# Patient Record
Sex: Male | Born: 1935 | Race: White | Hispanic: No | Smoking: Never smoker
Health system: Southern US, Community
[De-identification: ages and names within clinical notes are randomized; demographics above are authoritative.]

## PROBLEM LIST (undated history)

## (undated) DIAGNOSIS — F0390 Unspecified dementia without behavioral disturbance: Secondary | ICD-10-CM

## (undated) DIAGNOSIS — C801 Malignant (primary) neoplasm, unspecified: Secondary | ICD-10-CM

## (undated) DIAGNOSIS — R001 Bradycardia, unspecified: Secondary | ICD-10-CM

## (undated) DIAGNOSIS — K219 Gastro-esophageal reflux disease without esophagitis: Secondary | ICD-10-CM

## (undated) DIAGNOSIS — I509 Heart failure, unspecified: Secondary | ICD-10-CM

## (undated) DIAGNOSIS — I4589 Other specified conduction disorders: Secondary | ICD-10-CM

## (undated) DIAGNOSIS — IMO0002 Reserved for concepts with insufficient information to code with codable children: Secondary | ICD-10-CM

## (undated) DIAGNOSIS — I10 Essential (primary) hypertension: Secondary | ICD-10-CM

## (undated) DIAGNOSIS — E785 Hyperlipidemia, unspecified: Secondary | ICD-10-CM

## (undated) DIAGNOSIS — R9430 Abnormal result of cardiovascular function study, unspecified: Secondary | ICD-10-CM

## (undated) DIAGNOSIS — Z8719 Personal history of other diseases of the digestive system: Secondary | ICD-10-CM

## (undated) DIAGNOSIS — I2510 Atherosclerotic heart disease of native coronary artery without angina pectoris: Secondary | ICD-10-CM

## (undated) HISTORY — DX: Personal history of other diseases of the digestive system: Z87.19

## (undated) HISTORY — DX: Abnormal result of cardiovascular function study, unspecified: R94.30

## (undated) HISTORY — PX: COLECTOMY: SHX59

## (undated) HISTORY — DX: Other specified conduction disorders: I45.89

## (undated) HISTORY — DX: Essential (primary) hypertension: I10

## (undated) HISTORY — DX: Heart failure, unspecified: I50.9

## (undated) HISTORY — DX: Bradycardia, unspecified: R00.1

## (undated) HISTORY — DX: Atherosclerotic heart disease of native coronary artery without angina pectoris: I25.10

## (undated) HISTORY — PX: TONSILLECTOMY: SUR1361

## (undated) HISTORY — DX: Gastro-esophageal reflux disease without esophagitis: K21.9

## (undated) HISTORY — DX: Hyperlipidemia, unspecified: E78.5

## (undated) HISTORY — DX: Malignant (primary) neoplasm, unspecified: C80.1

## (undated) HISTORY — DX: Reserved for concepts with insufficient information to code with codable children: IMO0002

---

## 2007-12-14 ENCOUNTER — Ambulatory Visit: Payer: Self-pay | Admitting: Internal Medicine

## 2007-12-30 ENCOUNTER — Ambulatory Visit: Payer: Self-pay

## 2007-12-30 ENCOUNTER — Ambulatory Visit: Payer: Self-pay | Admitting: Internal Medicine

## 2007-12-30 ENCOUNTER — Encounter: Payer: Self-pay | Admitting: Internal Medicine

## 2008-02-06 ENCOUNTER — Ambulatory Visit: Payer: Self-pay | Admitting: Internal Medicine

## 2008-05-02 ENCOUNTER — Encounter: Payer: Self-pay | Admitting: Cardiology

## 2008-05-02 ENCOUNTER — Ambulatory Visit: Payer: Self-pay | Admitting: Cardiology

## 2008-09-10 ENCOUNTER — Encounter (INDEPENDENT_AMBULATORY_CARE_PROVIDER_SITE_OTHER): Payer: Self-pay | Admitting: *Deleted

## 2008-11-21 ENCOUNTER — Ambulatory Visit: Payer: Self-pay | Admitting: Cardiology

## 2008-11-21 ENCOUNTER — Encounter: Payer: Self-pay | Admitting: Cardiology

## 2010-01-15 ENCOUNTER — Encounter: Payer: Self-pay | Admitting: Cardiology

## 2010-01-15 ENCOUNTER — Ambulatory Visit: Payer: Self-pay | Admitting: Cardiology

## 2010-02-24 ENCOUNTER — Telehealth (INDEPENDENT_AMBULATORY_CARE_PROVIDER_SITE_OTHER): Payer: Self-pay | Admitting: Radiology

## 2010-02-25 ENCOUNTER — Ambulatory Visit: Admission: RE | Admit: 2010-02-25 | Discharge: 2010-02-25 | Payer: Self-pay | Source: Home / Self Care

## 2010-02-25 ENCOUNTER — Encounter: Payer: Self-pay | Admitting: Cardiovascular Disease

## 2010-02-25 ENCOUNTER — Encounter (HOSPITAL_COMMUNITY)
Admission: RE | Admit: 2010-02-25 | Discharge: 2010-03-18 | Payer: Self-pay | Source: Home / Self Care | Attending: Cardiology | Admitting: Cardiology

## 2010-02-25 ENCOUNTER — Encounter: Payer: Self-pay | Admitting: Cardiology

## 2010-02-26 ENCOUNTER — Telehealth: Payer: Self-pay | Admitting: Cardiology

## 2010-03-18 NOTE — Assessment & Plan Note (Signed)
Summary: [facility]   Visit Type:  Follow-up Primary Kelcey Korus:  Larina Bras  CC:  No complaints.  History of Present Illness: Pleasant male who presents for followup for coronary disease and ischemic cardiomyopathy improved by most recent echocardiogram.  Per Dr. Odessa Fleming previous notes the patient underwent a partial colectomy in September of 2009 at [facility]. He apparently had a respiratory arrest and a small myocardial infarction. He underwent cardiac catheterization and was found to have an ejection fraction of 10-15%, a totally occluded right coronary artery which was old and a patent marginal and circumflex. There was a patent LAD and a 90% diagonal. There were left to right collaterals. He has been treated medically. Note I do not have any of those records available. He saw Dr. Graciela Husbands on October 20 of 2009 for question ICD. However the patient did have a followup echocardiogram on November 13 of 2009. His ejection fraction was 50% with possible hypokinesis of the inferior posterior wall. There was mild mitral regurgitation. The patient also had an exercise treadmill to assess chronotropic competence. He exercised to stage II of the Naughton  protocol and achieved a heart rate of 88. I last saw him in Oct 2010. Since then he denies any dyspnea on exertion, orthopnea, pedal edema, palpitations, syncope or exertional chest pain.  Current Medications (verified): 1)  Aspirin Adult Low Strength 81 Mg Tbec (Aspirin) .... By Mouth Once Daily 2)  Cvs Omeprazole 20 Mg Tbec (Omeprazole) .... By Mouth Once Daily 3)  Coreg 6.25 Mg Tabs (Carvedilol) .... One Half Tab Two Times A Day 4)  Imdur 30 Mg Xr24h-Tab (Isosorbide Mononitrate) .... Once Daily 5)  Lisinopril 5 Mg Tabs (Lisinopril) .Marland Kitchen.. 1 Tablet By Mouth Daily 6)  Crestor 20 Mg Tabs (Rosuvastatin Calcium) .... 1/2 Tablet Daily 7)  Amlodipine Besylate 5 Mg Tabs (Amlodipine Besylate) .... Take One Tablet By Mouth Daily 8)   Tamsulosin Hcl 0.4 Mg Caps (Tamsulosin Hcl) .... Take 1 Capsule By Mouth Two Times A Day  Allergies (verified): No Known Drug Allergies  Past History:  Past Medical History: coronary artery disease H/O cardiomyopathy improved on most recent echo. Congestive heart failure - class II to III GE reflux disease History of ulcerative colitis Bradycardia  Colonic malignancy Hyperlipidemia Hypertension       Past Surgical History: His past surgical history is notable for his tonsillectomy and his colectomy.   Social History: Reviewed history from 05/01/2008 and no changes required. Married Tobacco Use - No.  Alcohol Use - yes--occasional Drug Use - no Retired   Review of Systems       Problems with constipation but no fevers or chills, productive cough, hemoptysis, dysphasia, odynophagia, melena, hematochezia, dysuria, hematuria, rash, seizure activity, orthopnea, PND, pedal edema, claudication. Remaining systems are negative.   Vital Signs:  Patient profile:   77 year old male Height:      66 inches Weight:      141.75 pounds BMI:     22.96 Pulse rate:   48 / minute Pulse rhythm:   regular Resp:     18 per minute BP sitting:   140 / 60  (left arm) Cuff size:   large  Vitals Entered By: Vikki Ports (January 15, 2010 10:16 AM)  Physical Exam  General:  Well-developed well-nourished in no acute distress.  Skin is warm and dry.  HEENT is normal.  Neck is supple. No thyromegaly.  Chest is clear to auscultation with normal expansion.  Cardiovascular exam is  regular rate and rhythm.  Abdominal exam nontender or distended. No masses palpated. Extremities show no edema. neuro grossly intact    EKG  Procedure date:  01/15/2010  Findings:      Marked sinus bradycardia at a rate of 48. No ST changes.  Impression & Recommendations:  Problem # 1:  CARDIOMYOPATHY, ISCHEMIC (ICD-414.8)  Improved on most recent echocardiogram. Continue present medications. The  following medications were removed from the medication list:    Furosemide 40 Mg Tabs (Furosemide) .Marland Kitchen... As needed if over 137 pounds His updated medication list for this problem includes:    Aspirin Adult Low Strength 81 Mg Tbec (Aspirin) ..... By mouth once daily    Coreg 6.25 Mg Tabs (Carvedilol) ..... One half tab two times a day    Imdur 30 Mg Xr24h-tab (Isosorbide mononitrate) ..... Once daily    Lisinopril 5 Mg Tabs (Lisinopril) .Marland Kitchen... 1 tablet by mouth daily    Amlodipine Besylate 5 Mg Tabs (Amlodipine besylate) .Marland Kitchen... Take one tablet by mouth daily  The following medications were removed from the medication list:    Furosemide 40 Mg Tabs (Furosemide) .Marland Kitchen... As needed if over 137 pounds His updated medication list for this problem includes:    Aspirin Adult Low Strength 81 Mg Tbec (Aspirin) ..... By mouth once daily    Coreg 6.25 Mg Tabs (Carvedilol) ..... One half tab two times a day    Imdur 30 Mg Xr24h-tab (Isosorbide mononitrate) ..... Once daily    Lisinopril 5 Mg Tabs (Lisinopril) .Marland Kitchen... 1 tablet by mouth daily    Amlodipine Besylate 5 Mg Tabs (Amlodipine besylate) .Marland Kitchen... Take one tablet by mouth daily  Problem # 2:  HYPERCHOLESTEROLEMIA-PURE (ICD-272.0) Continue statin. Lipids and liver monitored by primary care. His updated medication list for this problem includes:    Crestor 20 Mg Tabs (Rosuvastatin calcium) .Marland Kitchen... 1/2 tablet daily  His updated medication list for this problem includes:    Crestor 20 Mg Tabs (Rosuvastatin calcium) .Marland Kitchen... 1/2 tablet daily  Problem # 3:  CORONARY ATHEROSCLEROSIS NATIVE CORONARY ARTERY (ICD-414.01) Continue aspirin, beta blocker, ACE inhibitor and statin. Schedule Myoview for risk stratification. His updated medication list for this problem includes:    Aspirin Adult Low Strength 81 Mg Tbec (Aspirin) ..... By mouth once daily    Coreg 6.25 Mg Tabs (Carvedilol) ..... One half tab two times a day    Imdur 30 Mg Xr24h-tab (Isosorbide mononitrate)  ..... Once daily    Lisinopril 5 Mg Tabs (Lisinopril) .Marland Kitchen... 1 tablet by mouth daily    Amlodipine Besylate 5 Mg Tabs (Amlodipine besylate) .Marland Kitchen... Take one tablet by mouth daily  Orders: Nuclear Stress Test (Nuc Stress Test)  Problem # 4:  Hx of ESSENTIAL HYPERTENSION, BENIGN (ICD-401.1) Blood pressure control. Continue present medications. Potassium and renal function monitored by primary care. The following medications were removed from the medication list:    Furosemide 40 Mg Tabs (Furosemide) .Marland Kitchen... As needed if over 137 pounds His updated medication list for this problem includes:    Aspirin Adult Low Strength 81 Mg Tbec (Aspirin) ..... By mouth once daily    Coreg 6.25 Mg Tabs (Carvedilol) ..... One half tab two times a day    Lisinopril 5 Mg Tabs (Lisinopril) .Marland Kitchen... 1 tablet by mouth daily    Amlodipine Besylate 5 Mg Tabs (Amlodipine besylate) .Marland Kitchen... Take one tablet by mouth daily  Patient Instructions: 1)  Your physician wants you to follow-up in:ONE YEAR   You will receive a reminder letter  in the mail two months in advance. If you don't receive a letter, please call our office to schedule the follow-up appointment. 2)  Your physician has requested that you have an LEXISCAN stress myoview.  For further information please visit [email or web address]  Please follow instruction sheet, as given.

## 2010-03-20 NOTE — Assessment & Plan Note (Signed)
Summary: Cardiology Nuclear Testing  Nuclear Med Background Indications for Stress Test: Evaluation for Ischemia   History: Echo, GXT, Heart Catheterization, Myocardial Infarction  History Comments: '09 GXT /Cath / MI -resp. arrest/100%RCA & 90% DX with coll.; 11/09 Echo EF=50%; Hx. ICM /CHF & Hx. Bradycardia  Symptoms: DOE, SOB    Nuclear Pre-Procedure Cardiac Risk Factors: Hypertension, Lipids Caffeine/Decaff Intake: None NPO After: 8:30 AM Lungs: clear IV 0.9% NS with Angio Cath: 20g     IV Site: R Wrist IV Started by: Stanton Kidney, EMT-P Chest Size (in) 40     Height (in): 65 Weight (lb): 143 BMI: 23.88  Nuclear Med Study 1 or 2 day study:  1 day     Stress Test Type:  Eugenie Birks Reading MD:  Kristeen Miss, MD     Referring MD:  B.Crenshaw Resting Radionuclide:  Technetium 79m Tetrofosmin     Resting Radionuclide Dose:  10.9 mCi  Stress Radionuclide:  Technetium 81m Tetrofosmin     Stress Radionuclide Dose:  33 mCi   Stress Protocol  Max Systolic BP: 148 mm Hg Lexiscan: 0.4 mg   Stress Test Technologist:  Milana Na, EMT-P     Nuclear Technologist:  Domenic Polite, CNMT  Rest Procedure  Myocardial perfusion imaging was performed at rest 45 minutes following the intravenous administration of Technetium 51m Tetrofosmin.  Stress Procedure  The patient received IV Lexiscan 0.4 mg over 15-seconds.  Technetium 56m Tetrofosmin injected at 30-seconds. The patient had sob with infusion. There were no significant changes with infusion.  Quantitative spect images were obtained after a 45 minute delay.  QPS Raw Data Images:  There is interference from nuclear activity from structures below the diaphragm.   Stress Images:  There is apical thinning and decrease uptake in the inferior wall ( most likely due to uptake in the adjacent bowel) Rest Images:  There is apical thinning and decrease uptake in the inferior wall ( most likely due to uptake in the adjacent  bowel) Subtraction (SDS):  No evidence of ischemia. Transient Ischemic Dilatation:  .95  (Normal <1.22)  Lung/Heart Ratio:  .33  (Normal <0.45)  Quantitative Gated Spect Images QGS EDV:  112 ml QGS ESV:  46 ml QGS EF:  59 % QGS cine images:  Normal LV function.  Findings Low risk nuclear study      Overall Impression  Exercise Capacity: Lexiscan with no exercise. BP Response: Normal blood pressure response. Clinical Symptoms: No chest pain ECG Impression: No significant ST segment change suggestive of ischemia. Overall Impression: Low risk stress nuclear study. Overall Impression Comments: There is decreased uptake in the inferior wall with stress and rest.  This is most likely due to significant uptake of tracer in the adjacent bowel. The inferior wall contracts normally.  The LV function is normal.  Appended Document: Cardiology Nuclear Testing ok  Appended Document: Cardiology Nuclear Testing lmtcb./cy  Appended Document: Cardiology Nuclear Testing PT AWARE./CY

## 2010-03-20 NOTE — Progress Notes (Signed)
Summary: nuc pre-procedure  Phone Note Outgoing Call   Call placed by: Domenic Polite, CNMT,  February 24, 2010 12:22 PM Call placed to: Patient Reason for Call: Confirm/change Appt Summary of Call: Reviewed information on Myoview Information Sheet (see scanned document for further details).  Spoke with patient.      Nuclear Med Background Indications for Stress Test: Evaluation for Ischemia   History: Echo, GXT, Heart Catheterization, Myocardial Infarction  History Comments: '09 GXT /Cath / MI -resp. arrest/100%RCA & 90% DX with coll.; 11/09 Echo EF=50%; Hx. ICM /CHF & Hx. Bradycardia     Nuclear Pre-Procedure Cardiac Risk Factors: Hypertension, Lipids Height (in): 66

## 2010-03-20 NOTE — Progress Notes (Signed)
Summary: test results  Phone Note Call from Patient Call back at Home Phone [phone]   Caller: Patient Reason for Call: Talk to Nurse, Lab or Test Results Summary of Call: pt returning the call re test results. pt has an appt at 245pm pt would like to have a call back before 245pm today. Initial call taken by: Roe Coombs,  February 26, 2010 10:27 AM  Follow-up for Phone Call        Phone Call Completed PT AWARE OF MYOVIEW RESULTS. Follow-up by: Scherrie Bateman, LPN,  February 26, 2010 10:56 AM

## 2010-07-01 NOTE — Assessment & Plan Note (Signed)
[facility]                         ELECTROPHYSIOLOGY OFFICE NOTE   LEELAN, RAJEWSKI                      MRN:          [id number]  DATE:02/06/2008                            DOB:          1923/03/05    Mr. Harmes was admitted for a repeat treadmill testing to assess  chronotropic competence.  He achieved a maximum heart rate of 103, which  was 74% of his predicted maximal heart rate.  His blood pressure  elevation was brisk going from 151 systolic to 204 systolic.   He had no symptoms.  He did have some bigeminy on recovery.   ASSESSMENT:  Chronotropic competence.   We will plan to have him follow up with Dr. Jens Som at the [facility]  office in about 3 months' time.   He will follow up with Dr. Larina Bras in the interim.     Duke Salvia, MD, [facility]  Electronically Signed    SCK/MedQ  DD: 02/06/2008  DT: 02/07/2008  Job #: [id number]   cc:   Marlou Starks

## 2010-07-01 NOTE — Letter (Signed)
December 30, 2007    Dr. Marlou Starks  [address]  [place]  [place] [address]   RE:  Keith Conrad, Keith Conrad  MRN:  [id number]  /  DOB:  1923-07-01   Dear Dr. Larina Bras:   I have had the privilege of seeing Mr. Keith Conrad at the request of  his son for second opinion regarding need for an ICD in the setting of  his ischemic heart disease.  We also had a question on the table as to  whether he is chronotropically incompetent when he came in today for a  heart rate assessment on treadmill testing.  He was able to accomplish a  little bit of stage II on a Naughton protocol and achieved a maximum  heart rate of 88, which represents about 65-70% of his predicted maximum  heart rate.  Of greater note was his blood pressure, which was in the  range of 165-216, while he was here.  This was asymptomatic.  He had  been seen the other day and somebody had given him clonidine to take on  an as-needed basis for blood pressures above 170.   I have advised him today to take clonidine while he is here today to get  his blood pressure down and then we will put him on Norvasc 5 mg a day.  I have asked him to follow up with you in a week to two weeks' time.  Aggressive management of his blood pressure would be terrific and  then we could reassess his exercise capacity with anticipated treadmill  testing in about 2 months' time.   I look forward to talking to you.  If I can be of any help, do not  hesitate to call me.  My cell number is [phone].    Sincerely,      Duke Salvia, MD, [facility]  Electronically Signed    SCK/MedQ  DD: 12/30/2007  DT: 12/31/2007  Job #: [id number]

## 2010-07-01 NOTE — Letter (Signed)
December 14, 2007    Keith Abed, MD, [facility]  [address]  [address]  [place], [place] [address]   RE:  Keith, Conrad  MRN:  [id number]  /  DOB:  01-25-1924   Dear Keith Conrad,   It was a pleasure to see Keith Conrad, the father of your patient Keith Conrad, today for evaluation of his need for a defibrillator.   As you may or may not know, Keith Conrad is an 77 year old gentleman  who has had a very sparse past medical history apart from long-standing  treated hypertension.  As recently as the summer, he was pushing his  lawnmower over a half of an acre weekly.   In September, he underwent a partial colectomy for malignancy.  On the  second postoperative day.  He had what was called a class IV code  which sounds like, from his son, was a respiratory arrest.  He ended up  intubated.  He subsequently had a myocardial infarction with a peak  troponin recorded in the discharge summary of 2.87 and a peak MB of 26.9  and underwent catheterization at which time he was found to have an  ejection fraction of 10-15%, a totally occluded right which was old and  a patent marginal and patent circumflex system and a patent LAD with a  90% diagonal.  There were left-to-right collaterals.  He was seen in  consultation by EP, and an ICD was recommended.  Because of concerns,  they elected not to have it done.   He was subsequently discharged to rehab on a variety of medications  including lisinopril and spironolactone.  I should note that he had  hypokalemia in the hospital and then apparently developed hyperkalemia  while at rehab.  There are also issues of hypotension developing in  rehab, and there were subsequent discontinuation of a lot of his  medications.   This is potentially relevant as on the second or third day postdischarge  from rehab.  He had a syncopal episode.  He had gone to breakfast with a  lady friend.  He went to get out of the car having had breakfast.  He  recalls  nothing except opening the door and find himself on the floor of  the carport.  We do not know whether he stood up before he fell or not.   Since discharge, he has also had significant problems with exercise  intolerance.  He has fatigue, dyspnea on exertion, so that now he is  quite short of breath, walking up the driveway from his mailbox.  This  used to be not an issue.  He has also had some peripheral edema for  which Lasix was recently started with some improvement.  He does not  have nocturnal dyspnea or orthopnea.   He has had no palpitations.   He does have some orthostatic intolerance with abrupt standing.   His past related history is notable for bradycardia to the 30s in the  spring of 2009.  It looks like he was treated with atenolol for many  years for his blood pressure and then this spring became quite  bradycardic.  He did not recall exercise intolerance similar to his  current situation in the spring notwithstanding the low blood pressure.  This prompted the discontinuation of his atenolol.   His past medical history in addition to the above as noted is broadly  negative:  1. He does carry a secondary diagnosis of diabetes, although  he is on      no medications for it and I do not see any hemoglobin A1c.  2. GE reflux disease.  3. History of ulcerative colitis.   His past surgical history is notable for his tonsillectomy and his  colectomy.   His allergies include ATENOLOL and PSEUDOEPHEDRINE   His current medications include:  1. Aspirin 81.  2. Omeprazole 20.  3. Coreg 6.25 b.i.d.  4. Crestor 20.  5. Imdur 30.  6. Prilosec.  7. Lasix.   As noted, his ACE inhibitors and spironolactone were discontinued  because of low blood pressure/potassium issues.   SOCIAL HISTORY:  He is retired from [employer].  He has 2  children.  He does not use cigarettes or recreational drugs, and drinks  alcohol only rarely.   On examination, he is an elderly  Caucasian male who is somewhat hard of  hearing.  He was in no acute distress.  His blood pressure is 152/58.  His pulse was 53.  His weight was 143.  His HEENT exam demonstrated no  icterus or xanthoma, and his neck veins were 8 cm.  His carotids were  brisk and full bilaterally with a left-sided bruit.  The back was  without kyphosis or scoliosis.  The lungs were clear.  Heart sounds were  slow, but regular, and S4 was present.  The abdomen was soft with active  bowel sounds without midline pulsation or hepatomegaly.  Femoral pulses  were 2+.  Distal pulses were intact.  There was no clubbing, cyanosis,  or edema.  Neurological exam was grossly normal.  His skin was warm and  dry.   Electrocardiogram dated today demonstrated sinus rhythm at 53 with  intervals of 0.17/0.11/0.46.  The axis was 9 degrees.  There were T-wave  inversions across the lateral precordium.  We have no information as to  whether this is new or old.   IMPRESSION:  1. Ischemic and nonischemic cardiomyopathy with a totalled right      coronary artery and a high-grade diagonal lesion and ejection      fraction of 10-15% at post-arrest catheterization.  2. Congestive heart failure - class II to III.  3. Syncopal episode in the wake of discharge from rehab, question      orthostatic.  4. Bradycardia now and in the past, question chronotropic competence.  5. Cardiorespiratory arrest associated with a non-Q-wave myocardial      infarction at that time of his surgery.  6. Colonic malignancy requiring partial colectomy associated with the      above.   DISCUSSION:  Keith Conrad, Keith Conrad had what sounds like a respiratory  arrest in the wake of his partial colectomy.  It sounds like he then had  a non-STEMI which may have been a demand infarction.  He underwent a  catheterization and was found to have high-grade diagonal disease,  chronically occluded right, and ejection fraction of 10-15%.  It is  unclear as to the  duration of his nonischemic myopathy.  It is hard to  imagine that his ejection fraction is 10-15% given the vigor with which  he lived up until his surgery.  We do not have an alternative  explanation, however.   He currently has congestive heart failure which certainly might be  attributable to his ejection fraction; however, had the LVEF been  chronically low than that would not suffice as an explanation.  Chronotropic incompetence may well be contributing also and this may  have  been aggravated by his beta-blocker.   His syncopal episode is also quite worrisome.  Although by history it  sounds temporally related to a likely effort to get up from the car, any  episodes of syncope in cardiomyopathic patients is associated with high  risk of sudden death and appropriate ICD discharges.   We are also left with the issue of the duration of his nonischemic  myopathy and hoping that there will be some improvement in it not  knowing what its course has been here to date.  As he did not undergo  revascularization, that issue is not relevant.   After a lengthy discussion with Mr. Catalfamo and his son Harvie Heck, we have  elected to pursue the following course:  1. Repeated 2-D echo to see if there has been any interval improvement      in his ejection fraction.  2. Undertake treadmill testing to see to what degree he is      chronotropically limited.   These things may prompt intervention with a device, the specific type of  which we could then decided upon.   He also will need to be restarted on his ACE inhibitor with close  followup by his primary care physician, and we will arrange that at his  next visit which I think is scheduled for next week.   Keith Conrad, thank you very much allowing me to participate in the care of your  friend's father.    Sincerely,      Duke Salvia, MD, [facility]  Electronically Signed    SCK/MedQ  DD: 12/14/2007  DT: 12/15/2007  Job #: [id number]

## 2010-09-30 ENCOUNTER — Encounter: Payer: Self-pay | Admitting: Cardiology

## 2011-02-03 ENCOUNTER — Encounter: Payer: Self-pay | Admitting: Cardiology

## 2011-02-04 ENCOUNTER — Ambulatory Visit (INDEPENDENT_AMBULATORY_CARE_PROVIDER_SITE_OTHER): Payer: Medicare Other | Admitting: Cardiology

## 2011-02-04 ENCOUNTER — Encounter: Payer: Self-pay | Admitting: Cardiology

## 2011-02-04 DIAGNOSIS — I2589 Other forms of chronic ischemic heart disease: Secondary | ICD-10-CM

## 2011-02-04 DIAGNOSIS — E7800 Pure hypercholesterolemia, unspecified: Secondary | ICD-10-CM

## 2011-02-04 DIAGNOSIS — I2510 Atherosclerotic heart disease of native coronary artery without angina pectoris: Secondary | ICD-10-CM

## 2011-02-04 DIAGNOSIS — I10 Essential (primary) hypertension: Secondary | ICD-10-CM

## 2011-02-04 NOTE — Assessment & Plan Note (Signed)
But pressure controlled. Continue present medications. Check potassium and renal function.

## 2011-02-04 NOTE — Assessment & Plan Note (Signed)
Continue aspirin and statin. Last Myoview low-risk. Recent chest pain does not sound cardiac. Continue medical therapy.

## 2011-02-04 NOTE — Assessment & Plan Note (Signed)
Continue statin. Check lipids and liver. 

## 2011-02-04 NOTE — Assessment & Plan Note (Signed)
Ejection fraction improved on most recent echo.

## 2011-02-04 NOTE — Progress Notes (Signed)
Keith Conrad:[id number] male who presents for followup for coronary disease and ischemic cardiomyopathy improved by most recent echocardiogram.  Per Dr. Odessa Fleming previous notes the patient underwent a partial colectomy in September of 2009 at [facility]. He apparently had a respiratory arrest and a small myocardial infarction. He underwent cardiac catheterization and was found to have an ejection fraction of 10-15%, a totally occluded right coronary artery which was old and a patent marginal and circumflex. There was a patent LAD and a 90% diagonal. There were left to right collaterals. He has been treated medically. Note I do not have any of those records available. Followup echocardiogram on November 13 of 2009 - His ejection fraction was 50% with possible hypokinesis of the inferior posterior wall. There was mild mitral regurgitation. The patient also had an exercise treadmill to assess chronotropic competence. He exercised to stage II of the Naughton  protocol and achieved a heart rate of 88. Myoview in Jan 2012 showed EF 59 and fixed inferior defect with no ischemia. I last saw him in Nov 2011. Since then he denies any dyspnea on exertion, orthopnea, pedal edema, palpitations, syncope or exertional chest pain. He does state that he had one second of chest pain in the left breast area approximately 4 days ago.  Current Outpatient Prescriptions  Medication Sig Dispense Refill  . amLODipine (NORVASC) 5 MG tablet Take 5 mg by mouth daily.        Marland Kitchen aspirin (ASPIRIN ADULT LOW STRENGTH) 81 MG EC tablet Take 81 mg by mouth daily.        . carvedilol (COREG) 6.25 MG tablet Take 3.125 mg by mouth 2 (two) times daily with a meal.        . isosorbide mononitrate (IMDUR) 30 MG 24 hr tablet Take 30 mg by mouth daily.        Marland Kitchen omeprazole (PRILOSEC) 20 MG capsule Take 20 mg by mouth daily.        . rosuvastatin (CRESTOR) 20 MG tablet Take 10 mg by mouth daily.           Past Medical History  Diagnosis  Date  . CAD (coronary artery disease)   . Cardiomyopathy     H/O improved on most recent echo  . Congestive heart failure     class II to III  . GERD (gastroesophageal reflux disease)   . History of ulcerative colitis   . Bradycardia   . Hyperlipidemia   . Hypertension   . Malignancy     colonic    Past Surgical History  Procedure Date  . Tonsillectomy   . Colectomy     History   Social History  . Marital Status: Widowed    Spouse Name: N/A    Number of Children: N/A  . Years of Education: N/A   Occupational History  . Retired    Social History Main Topics  . Smoking status: Never Smoker   . Smokeless tobacco: Not on file  . Alcohol Use: Yes     occasional  . Drug Use: No  . Sexually Active: Not on file   Other Topics Concern  . Not on file   Social History Narrative  . No narrative on file    ROS: no fevers or chills, productive cough, hemoptysis, dysphasia, odynophagia, melena, hematochezia, dysuria, hematuria, rash, seizure activity, orthopnea, PND, pedal edema, claudication. Remaining systems are negative.  Physical Exam: Well-developed well-nourished in no acute distress.  Skin is warm and dry.  HEENT is normal.  Neck is supple. No thyromegaly.  Chest is clear to auscultation with normal expansion.  Cardiovascular exam is regular rate and rhythm.  Abdominal exam nontender or distended. No masses palpated. Extremities show no edema. neuro grossly intact  ECG sinus bradycardia at a rate of 53. Axis normal. No ST changes.

## 2011-02-04 NOTE — Patient Instructions (Signed)
Your physician wants you to follow-up in: ONE YEAR You will receive a reminder letter in the mail two months in advance. If you don't receive a letter, please call our office to schedule the follow-up appointment.  

## 2011-02-24 ENCOUNTER — Other Ambulatory Visit: Payer: Self-pay | Admitting: Cardiology

## 2011-02-24 ENCOUNTER — Telehealth: Payer: Self-pay | Admitting: Cardiology

## 2011-02-24 NOTE — Telephone Encounter (Signed)
New problem:  Per [facility] lab pt need an order for bmet/ lipid & liver [phone], [phone]. Fax # [phone].

## 2011-02-24 NOTE — Telephone Encounter (Signed)
Orders printed and faxed to number provided.

## 2011-02-25 ENCOUNTER — Encounter: Payer: Self-pay | Admitting: *Deleted

## 2011-02-25 LAB — LIPID PANEL
HDL: 59 mg/dL (ref >39)
LDL Cholesterol: 78 mg/dL (ref 0–99)
Triglycerides: 55 mg/dL (ref <150)
VLDL: 11 mg/dL (ref 0–40)

## 2011-02-25 LAB — HEPATIC FUNCTION PANEL
Albumin: 4.2 g/dL (ref 3.5–5.2)
Alkaline Phosphatase: 61 U/L (ref 39–117)
Total Bilirubin: 0.9 mg/dL (ref 0.3–1.2)
Total Protein: 6.6 g/dL (ref 6.0–8.3)

## 2011-02-25 LAB — BASIC METABOLIC PANEL WITH GFR
BUN: 18 mg/dL (ref 6–23)
CO2: 23 mEq/L (ref 19–32)
Calcium: 9.3 mg/dL (ref 8.4–10.5)
GFR, Est African American: 78 mL/min
Glucose, Bld: 111 mg/dL — ABNORMAL HIGH (ref 70–99)
Sodium: 139 mEq/L (ref 135–145)

## 2011-03-02 ENCOUNTER — Telehealth: Payer: Self-pay | Admitting: Cardiology

## 2011-03-02 NOTE — Telephone Encounter (Signed)
F/U   Patient's son Randol wants to Speak with Dr. Myrtis Ser even though patient is currently under the care of Dr. Jens Som. Interested in transfering care to Dr. Myrtis Ser.  Please advise, son Randol can be reached at his work # [phone]

## 2011-03-02 NOTE — Telephone Encounter (Signed)
I talked with pt's son, Harvie Heck. (He is a patient of Dr Myrtis Ser).  Pt's son is requesting to talk with Dr Myrtis Ser about his father. His father is a patient of Dr Jens Som but he wants to get Dr Henrietta Hoover thoughts about some things. Pt's son is aware Dr Myrtis Ser is out of the office for several days. Pt's son states it is not urgent and he has an appt with Dr Myrtis Ser 03/23/11 and can talk with him then if he does not get a chance to call before then.

## 2011-03-20 ENCOUNTER — Encounter: Payer: Self-pay | Admitting: Cardiology

## 2011-03-20 DIAGNOSIS — R001 Bradycardia, unspecified: Secondary | ICD-10-CM | POA: Insufficient documentation

## 2011-03-20 DIAGNOSIS — I429 Cardiomyopathy, unspecified: Secondary | ICD-10-CM | POA: Insufficient documentation

## 2011-03-20 DIAGNOSIS — E785 Hyperlipidemia, unspecified: Secondary | ICD-10-CM | POA: Insufficient documentation

## 2011-03-20 DIAGNOSIS — I10 Essential (primary) hypertension: Secondary | ICD-10-CM | POA: Insufficient documentation

## 2011-03-20 DIAGNOSIS — IMO0002 Reserved for concepts with insufficient information to code with codable children: Secondary | ICD-10-CM | POA: Insufficient documentation

## 2011-03-20 DIAGNOSIS — R9430 Abnormal result of cardiovascular function study, unspecified: Secondary | ICD-10-CM | POA: Insufficient documentation

## 2011-03-20 DIAGNOSIS — I4589 Other specified conduction disorders: Secondary | ICD-10-CM | POA: Insufficient documentation

## 2011-03-20 DIAGNOSIS — C801 Malignant (primary) neoplasm, unspecified: Secondary | ICD-10-CM | POA: Insufficient documentation

## 2011-03-20 DIAGNOSIS — I2510 Atherosclerotic heart disease of native coronary artery without angina pectoris: Secondary | ICD-10-CM | POA: Insufficient documentation

## 2011-03-20 DIAGNOSIS — K219 Gastro-esophageal reflux disease without esophagitis: Secondary | ICD-10-CM | POA: Insufficient documentation

## 2011-03-20 DIAGNOSIS — I509 Heart failure, unspecified: Secondary | ICD-10-CM | POA: Insufficient documentation

## 2011-03-20 DIAGNOSIS — Z8719 Personal history of other diseases of the digestive system: Secondary | ICD-10-CM | POA: Insufficient documentation

## 2011-03-23 ENCOUNTER — Encounter: Payer: Self-pay | Admitting: Cardiology

## 2011-03-23 NOTE — Progress Notes (Signed)
This patient had been seen in our cardiology office. In speaking with his son today I understand that the patient is now being seen by Dr. Tereso Newcomer in [facility]. I did not know if our records had ever been sent to his [facility] cardiologist. Therefore this note along with the patient's last office note will be faxed to Dr. Tereso Newcomer.

## 2012-11-27 ENCOUNTER — Encounter (HOSPITAL_COMMUNITY): Payer: Self-pay | Admitting: Emergency Medicine

## 2012-11-27 ENCOUNTER — Emergency Department (HOSPITAL_COMMUNITY): Payer: Medicare Other

## 2012-11-27 ENCOUNTER — Emergency Department (HOSPITAL_COMMUNITY)
Admission: EM | Admit: 2012-11-27 | Discharge: 2012-11-28 | Disposition: A | Discharge status: Skilled Nursing Facility | Payer: Medicare Other | Attending: Emergency Medicine | Admitting: Emergency Medicine

## 2012-11-27 DIAGNOSIS — W19XXXA Unspecified fall, initial encounter: Secondary | ICD-10-CM

## 2012-11-27 DIAGNOSIS — Z85038 Personal history of other malignant neoplasm of large intestine: Secondary | ICD-10-CM | POA: Insufficient documentation

## 2012-11-27 DIAGNOSIS — I509 Heart failure, unspecified: Secondary | ICD-10-CM | POA: Insufficient documentation

## 2012-11-27 DIAGNOSIS — K219 Gastro-esophageal reflux disease without esophagitis: Secondary | ICD-10-CM | POA: Insufficient documentation

## 2012-11-27 DIAGNOSIS — Z9861 Coronary angioplasty status: Secondary | ICD-10-CM | POA: Insufficient documentation

## 2012-11-27 DIAGNOSIS — Z862 Personal history of diseases of the blood and blood-forming organs and certain disorders involving the immune mechanism: Secondary | ICD-10-CM | POA: Insufficient documentation

## 2012-11-27 DIAGNOSIS — S60229A Contusion of unspecified hand, initial encounter: Secondary | ICD-10-CM | POA: Insufficient documentation

## 2012-11-27 DIAGNOSIS — Z8719 Personal history of other diseases of the digestive system: Secondary | ICD-10-CM | POA: Insufficient documentation

## 2012-11-27 DIAGNOSIS — W06XXXA Fall from bed, initial encounter: Secondary | ICD-10-CM | POA: Insufficient documentation

## 2012-11-27 DIAGNOSIS — S51009A Unspecified open wound of unspecified elbow, initial encounter: Secondary | ICD-10-CM | POA: Insufficient documentation

## 2012-11-27 DIAGNOSIS — Y9389 Activity, other specified: Secondary | ICD-10-CM | POA: Insufficient documentation

## 2012-11-27 DIAGNOSIS — Z7982 Long term (current) use of aspirin: Secondary | ICD-10-CM | POA: Insufficient documentation

## 2012-11-27 DIAGNOSIS — F0390 Unspecified dementia without behavioral disturbance: Secondary | ICD-10-CM | POA: Insufficient documentation

## 2012-11-27 DIAGNOSIS — Z79899 Other long term (current) drug therapy: Secondary | ICD-10-CM | POA: Insufficient documentation

## 2012-11-27 DIAGNOSIS — Y9210 Unspecified residential institution as the place of occurrence of the external cause: Secondary | ICD-10-CM | POA: Insufficient documentation

## 2012-11-27 DIAGNOSIS — I4891 Unspecified atrial fibrillation: Secondary | ICD-10-CM | POA: Insufficient documentation

## 2012-11-27 DIAGNOSIS — S60222A Contusion of left hand, initial encounter: Secondary | ICD-10-CM

## 2012-11-27 DIAGNOSIS — I2510 Atherosclerotic heart disease of native coronary artery without angina pectoris: Secondary | ICD-10-CM | POA: Insufficient documentation

## 2012-11-27 DIAGNOSIS — S51011A Laceration without foreign body of right elbow, initial encounter: Secondary | ICD-10-CM

## 2012-11-27 DIAGNOSIS — Z8639 Personal history of other endocrine, nutritional and metabolic disease: Secondary | ICD-10-CM | POA: Insufficient documentation

## 2012-11-27 NOTE — ED Notes (Signed)
Bed: WA03 Expected date:  Expected time:  Means of arrival:  Comments: EMS 79 yo Fall, skin tear

## 2012-11-27 NOTE — ED Notes (Signed)
Per EMS report: pt coming from [facility] nursing home with c/o unwitnessed fall. Per EMS pt didn't hit head, only injury noted is skin tear on right elbow. Per EMS pt is A+O x 1. Pt has hx of dementia. Per EMS pt denies pain.

## 2012-11-28 LAB — CBC WITH DIFFERENTIAL/PLATELET
Basophils Relative: 1 % (ref 0–1)
Eosinophils Absolute: 0.5 10*3/uL (ref 0.0–0.7)
Eosinophils Relative: 4 % (ref 0–5)
Lymphs Abs: 2.3 10*3/uL (ref 0.7–4.0)
MCH: 30.9 pg (ref 26.0–34.0)
MCHC: 34.3 g/dL (ref 30.0–36.0)
MCV: 90.1 fL (ref 78.0–100.0)
Neutrophils Relative %: 63 % (ref 43–77)
Platelets: 290 10*3/uL (ref 150–400)

## 2012-11-28 LAB — COMPREHENSIVE METABOLIC PANEL
ALT: 18 U/L (ref 0–53)
Albumin: 3.1 g/dL — ABNORMAL LOW (ref 3.5–5.2)
Alkaline Phosphatase: 66 U/L (ref 39–117)
BUN: 14 mg/dL (ref 6–23)
Calcium: 8.6 mg/dL (ref 8.4–10.5)
GFR calc Af Amer: 84 mL/min — ABNORMAL LOW (ref >90)
Glucose, Bld: 124 mg/dL — ABNORMAL HIGH (ref 70–99)
Potassium: 4.2 mEq/L (ref 3.5–5.1)
Sodium: 135 mEq/L (ref 135–145)
Total Protein: 6.3 g/dL (ref 6.0–8.3)

## 2012-11-28 LAB — URINALYSIS, ROUTINE W REFLEX MICROSCOPIC
Glucose, UA: NEGATIVE mg/dL
Ketones, ur: NEGATIVE mg/dL
Leukocytes, UA: NEGATIVE
Nitrite: NEGATIVE
Specific Gravity, Urine: 1.02 (ref 1.005–1.030)
Urobilinogen, UA: 0.2 mg/dL (ref 0.0–1.0)
pH: 5 (ref 5.0–8.0)

## 2012-11-28 LAB — TROPONIN I: Troponin I: <0.3 ng/mL (ref <0.30)

## 2012-11-28 NOTE — ED Notes (Signed)
[facility] called for transport back to nursing facility. 

## 2012-11-28 NOTE — ED Provider Notes (Signed)
CSN: [id number]     Arrival date & time 11/27/12  2322 History   First MD Initiated Contact with Patient 11/27/12 2328     Chief Complaint  Patient presents with  . Fall   (Consider location/radiation/quality/duration/timing/severity/associated sxs/prior Treatment) HPI Patient has baseline dementia is oriented only to person. He stays at [facility] and was found beside his bed by the staff. He fall was unwitnessed. Patient is unable to count the events of the fall. Level V caveat applies. Patient denies any pain including head neck pain, chest chest pain, abdominal pain. He has contusion to his left hand and skin tear to his right elbow. Family is at bedside and states he is at his baseline mental status. Past Medical History  Diagnosis Date  . CAD (coronary artery disease)     Catheterization September, 2009, total RCA, left to right collaterals, 90% diagonal, EF 10-15%  /   EF improved clinically over time  . Cardiomyopathy     EF 10-15% September, 2009  /  improved, echo, November, 2009, EF 50%,  posterior hypokinesis  . Congestive heart failure     class II to III  . GERD (gastroesophageal reflux disease)   . History of ulcerative colitis   . Bradycardia   . Hyperlipidemia   . Hypertension   . Malignancy     colonic  . Chronotropic incompetence     Assess by Dr. Graciela Husbands 2009, treadmill September, 2009 maximal heart rate 88  /   treadmill December, 2009, maximum heart rate 103, no further evaluation  . Ejection fraction     EF 10-15%, September, 2009  / iimproved, EF 50%, echo, November, 2009  /   EF 55%, nuclear, January, 2012   Past Surgical History  Procedure Laterality Date  . Tonsillectomy    . Colectomy     History reviewed. No pertinent family history. History  Substance Use Topics  . Smoking status: Never Smoker   . Smokeless tobacco: Not on file  . Alcohol Use: Yes     Comment: occasional    Review of Systems  Unable to perform ROS: Dementia     Allergies  Review of patient's allergies indicates no known allergies.  Home Medications   Current Outpatient Rx  Name  Route  Sig  Dispense  Refill  . aspirin (ASPIRIN ADULT LOW STRENGTH) 81 MG EC tablet   Oral   Take 81 mg by mouth every morning.          . B Complex-C (B-COMPLEX WITH VITAMIN C) tablet   Oral   Take 1 tablet by mouth every morning.         . carvedilol (COREG) 3.125 MG tablet   Oral   Take 3.125 mg by mouth 2 (two) times daily with a meal.         . citalopram (CELEXA) 20 MG tablet   Oral   Take 20 mg by mouth every morning.         . digoxin (LANOXIN) 0.125 MG tablet   Oral   Take 0.125 mg by mouth every morning.         Marland Kitchen guaifenesin (ROBITUSSIN) 100 MG/5ML syrup   Oral   Take 200 mg by mouth 4 (four) times daily as needed for cough.         Marland Kitchen lisinopril (PRINIVIL,ZESTRIL) 5 MG tablet   Oral   Take 5 mg by mouth every morning.         Marland Kitchen QUEtiapine (  SEROQUEL) 100 MG tablet   Oral   Take 100 mg by mouth at bedtime.         Marland Kitchen QUEtiapine (SEROQUEL) 25 MG tablet   Oral   Take 12.5 mg by mouth every morning.         . risperiDONE (RISPERDAL) 0.5 MG tablet   Oral   Take 0.5 mg by mouth 2 (two) times daily.          BP 173/95  Pulse 69  Temp(Src) 98.6 F (37 C) (Oral)  Resp 18  SpO2 96% Physical Exam  Nursing note and vitals reviewed. Constitutional: He is oriented to person, place, and time. He appears well-developed and well-nourished. No distress.  HENT:  Head: Normocephalic and atraumatic.  Mouth/Throat: Oropharynx is clear and moist.  Eyes: EOM are normal. Pupils are equal, round, and reactive to light.  Neck: Normal range of motion. Neck supple.  No posterior cervical spine tenderness to palpation.  Cardiovascular: Normal rate and regular rhythm.   Pulmonary/Chest: Effort normal. No respiratory distress. He has no wheezes. He has rales (mild scattered rales.).  Abdominal: Soft. Bowel sounds are normal. He  exhibits no distension and no mass. There is no tenderness. There is no rebound and no guarding.  Musculoskeletal: Normal range of motion. He exhibits no edema and no tenderness.  Patient has a hematoma to the posterior surface of the left hand. Range of motion of his left wrist and fingers. Patient has a skin tear roughly 2 cm in diameter to the right elbow. He has no underlying bony abnormality is full range of motion of the right elbow.  Neurological: He is alert and oriented to person, place, and time.  Patient is alert and oriented x1 with clear, goal oriented speech. Patient has 5/5 motor in all extremities. Sensation is intact to light touch.   Skin: Skin is warm and dry. No rash noted. No erythema.  Psychiatric: He has a normal mood and affect. His behavior is normal.    ED Course  Procedures (including critical care time) Labs Review Labs Reviewed  CBC WITH DIFFERENTIAL  COMPREHENSIVE METABOLIC PANEL  URINALYSIS, ROUTINE W REFLEX MICROSCOPIC  TROPONIN I   Imaging Review No results found.  EKG Interpretation   None       Date: 11/28/2012  Rate: 76  Rhythm: atrial fibrillation  QRS Axis: normal  Intervals: normal  ST/T Wave abnormalities: normal  Conduction Disutrbances:none  Narrative Interpretation:   Old EKG Reviewed: changes noted   MDM  Discussed with son. States that patient has a history of atrial fibrillation and is being followed by his cardiologist and primary Dr. At this time. They have not started him on blood thinners due to his fall risk. Patient's vital signs remained stable in the emergency department. We'll discharge back to nursing home.   Loren Racer, MD 11/28/12 [phone]

## 2012-11-29 ENCOUNTER — Emergency Department (HOSPITAL_COMMUNITY): Payer: Medicare Other

## 2012-11-29 ENCOUNTER — Emergency Department (HOSPITAL_COMMUNITY)
Admission: EM | Admit: 2012-11-29 | Discharge: 2012-11-30 | Disposition: A | Discharge status: Home / Self Care | Payer: Medicare Other | Attending: Emergency Medicine | Admitting: Emergency Medicine

## 2012-11-29 DIAGNOSIS — Z7982 Long term (current) use of aspirin: Secondary | ICD-10-CM | POA: Insufficient documentation

## 2012-11-29 DIAGNOSIS — J4489 Other specified chronic obstructive pulmonary disease: Secondary | ICD-10-CM | POA: Insufficient documentation

## 2012-11-29 DIAGNOSIS — Z8639 Personal history of other endocrine, nutritional and metabolic disease: Secondary | ICD-10-CM | POA: Insufficient documentation

## 2012-11-29 DIAGNOSIS — J449 Chronic obstructive pulmonary disease, unspecified: Secondary | ICD-10-CM

## 2012-11-29 DIAGNOSIS — Z79899 Other long term (current) drug therapy: Secondary | ICD-10-CM | POA: Insufficient documentation

## 2012-11-29 DIAGNOSIS — R05 Cough: Secondary | ICD-10-CM | POA: Insufficient documentation

## 2012-11-29 DIAGNOSIS — I10 Essential (primary) hypertension: Secondary | ICD-10-CM | POA: Insufficient documentation

## 2012-11-29 DIAGNOSIS — I2510 Atherosclerotic heart disease of native coronary artery without angina pectoris: Secondary | ICD-10-CM | POA: Insufficient documentation

## 2012-11-29 DIAGNOSIS — R059 Cough, unspecified: Secondary | ICD-10-CM | POA: Insufficient documentation

## 2012-11-29 DIAGNOSIS — F0390 Unspecified dementia without behavioral disturbance: Secondary | ICD-10-CM | POA: Insufficient documentation

## 2012-11-29 DIAGNOSIS — Z8719 Personal history of other diseases of the digestive system: Secondary | ICD-10-CM | POA: Insufficient documentation

## 2012-11-29 DIAGNOSIS — Z85038 Personal history of other malignant neoplasm of large intestine: Secondary | ICD-10-CM | POA: Insufficient documentation

## 2012-11-29 DIAGNOSIS — I509 Heart failure, unspecified: Secondary | ICD-10-CM | POA: Insufficient documentation

## 2012-11-29 DIAGNOSIS — Z862 Personal history of diseases of the blood and blood-forming organs and certain disorders involving the immune mechanism: Secondary | ICD-10-CM | POA: Insufficient documentation

## 2012-11-29 LAB — COMPREHENSIVE METABOLIC PANEL
ALT: 56 U/L — ABNORMAL HIGH (ref 0–53)
Albumin: 3.1 g/dL — ABNORMAL LOW (ref 3.5–5.2)
Alkaline Phosphatase: 75 U/L (ref 39–117)
BUN: 18 mg/dL (ref 6–23)
CO2: 26 mEq/L (ref 19–32)
Calcium: 8.6 mg/dL (ref 8.4–10.5)
Chloride: 99 mEq/L (ref 96–112)
Creatinine, Ser: 1.06 mg/dL (ref 0.50–1.35)
GFR calc Af Amer: 71 mL/min — ABNORMAL LOW (ref >90)
Glucose, Bld: 153 mg/dL — ABNORMAL HIGH (ref 70–99)
Potassium: 3.8 mEq/L (ref 3.5–5.1)
Sodium: 133 mEq/L — ABNORMAL LOW (ref 135–145)
Total Bilirubin: 0.5 mg/dL (ref 0.3–1.2)
Total Protein: 6.2 g/dL (ref 6.0–8.3)

## 2012-11-29 LAB — URINALYSIS, ROUTINE W REFLEX MICROSCOPIC
Bilirubin Urine: NEGATIVE
Glucose, UA: NEGATIVE mg/dL
Ketones, ur: NEGATIVE mg/dL
Nitrite: NEGATIVE
Protein, ur: NEGATIVE mg/dL
Specific Gravity, Urine: 1.031 — ABNORMAL HIGH (ref 1.005–1.030)
pH: 5 (ref 5.0–8.0)

## 2012-11-29 LAB — CBC WITH DIFFERENTIAL/PLATELET
Eosinophils Absolute: 0.1 10*3/uL (ref 0.0–0.7)
Eosinophils Relative: 2 % (ref 0–5)
Hemoglobin: 11.5 g/dL — ABNORMAL LOW (ref 13.0–17.0)
Lymphocytes Relative: 14 % (ref 12–46)
Lymphs Abs: 1.3 10*3/uL (ref 0.7–4.0)
Monocytes Relative: 11 % (ref 3–12)
Neutro Abs: 7.1 10*3/uL (ref 1.7–7.7)
Neutrophils Relative %: 74 % (ref 43–77)
Platelets: 321 10*3/uL (ref 150–400)
RBC: 3.81 MIL/uL — ABNORMAL LOW (ref 4.22–5.81)
WBC: 9.6 10*3/uL (ref 4.0–10.5)

## 2012-11-29 LAB — POCT I-STAT TROPONIN I: Troponin i, poc: 0.05 ng/mL (ref 0.00–0.08)

## 2012-11-29 NOTE — ED Notes (Signed)
Bed: WA02 Expected date: 11/29/12 Expected time: 8:53 PM Means of arrival: Ambulance Comments: 79 yo M  Cough, possible aspiration

## 2012-11-29 NOTE — ED Notes (Signed)
Pt fell two days ago and was evaluated and dx with bronchitis. Given antibiotics which he was taking tonight and choked. Possible aspiration. [facility] [place].

## 2012-11-29 NOTE — ED Provider Notes (Signed)
CSN: [id number]     Arrival date & time 11/29/12  2109 History   First MD Initiated Contact with Patient 11/29/12 2127     Chief Complaint  Patient presents with  . Cough   (Consider location/radiation/quality/duration/timing/severity/associated sxs/prior Treatment) The history is provided by a relative. No language interpreter was used.  Keith Conrad is an 79 y/o M with PMHx of CAD, cardiomyopathy, CHF, bradycardia, HLD, HTN presenting to the ED, with family, from [facility], regarding cough that has been ongoing for the past 2 weeks - daughter reported that she is concerned because she noticed that the coughing has gotten worse. Reported that the patient gets these coughing fits and notices that his face turns red when he coughs. Family reported that they have noticed mucus production - stated that he has been coughing up yellowish-greenish sputum. Family reported that at times they hear a rattling when the patient breathes. Family reported that patient was seen in the ED approximately 2-3 days ago regarding fall and chest discomfort - family reported that chest xray and labs all were negative findings. Family reported that patient was seen by physician of [facility] - reported that physician wanted to start patient on robotussin and antibiotics for sinuses. Family reported that [facility] nursing staff were concerned since patient has been having increase in coughing. Reported that no medications were given tonight due to fear of patient choking. When asked family whether or not they heard news of the patient coughing, family reported that they did not hear of this news. Family denied falls, gasping of air, chills, sweating, recent fevers, patient complaining of chest pain/shortness of breath/difficulty breathing.  PCP at [facility]   Past Medical History  Diagnosis Date  . CAD (coronary artery disease)     Catheterization September, 2009, total RCA, left to right  collaterals, 90% diagonal, EF 10-15%  /   EF improved clinically over time  . Cardiomyopathy     EF 10-15% September, 2009  /  improved, echo, November, 2009, EF 50%,  posterior hypokinesis  . Congestive heart failure     class II to III  . GERD (gastroesophageal reflux disease)   . History of ulcerative colitis   . Bradycardia   . Hyperlipidemia   . Hypertension   . Malignancy     colonic  . Chronotropic incompetence     Assess by Dr. Graciela Husbands 2009, treadmill September, 2009 maximal heart rate 88  /   treadmill December, 2009, maximum heart rate 103, no further evaluation  . Ejection fraction     EF 10-15%, September, 2009  / iimproved, EF 50%, echo, November, 2009  /   EF 55%, nuclear, January, 2012   Past Surgical History  Procedure Laterality Date  . Tonsillectomy    . Colectomy     No family history on file. History  Substance Use Topics  . Smoking status: Never Smoker   . Smokeless tobacco: Not on file  . Alcohol Use: Yes     Comment: occasional    Review of Systems  Unable to perform ROS: Dementia  Respiratory: Positive for cough (as per family).     Allergies  Review of patient's allergies indicates no known allergies.  Home Medications   Current Outpatient Rx  Name  Route  Sig  Dispense  Refill  . aspirin (ASPIRIN ADULT LOW STRENGTH) 81 MG EC tablet   Oral   Take 81 mg by mouth every morning.          Marland Kitchen  B Complex-C (B-COMPLEX WITH VITAMIN C) tablet   Oral   Take 1 tablet by mouth every morning.         . carvedilol (COREG) 3.125 MG tablet   Oral   Take 3.125 mg by mouth 2 (two) times daily with a meal.         . citalopram (CELEXA) 20 MG tablet   Oral   Take 20 mg by mouth every morning.         . digoxin (LANOXIN) 0.125 MG tablet   Oral   Take 0.125 mg by mouth every morning.         Marland Kitchen doxycycline (VIBRA-TABS) 100 MG tablet   Oral   Take 100 mg by mouth 2 (two) times daily.         Marland Kitchen guaifenesin (ROBITUSSIN) 100 MG/5ML syrup    Oral   Take 200 mg by mouth 4 (four) times daily as needed for cough.         Marland Kitchen lisinopril (PRINIVIL,ZESTRIL) 5 MG tablet   Oral   Take 5 mg by mouth every morning.         Marland Kitchen QUEtiapine (SEROQUEL) 100 MG tablet   Oral   Take 100 mg by mouth at bedtime.         Marland Kitchen QUEtiapine (SEROQUEL) 25 MG tablet   Oral   Take 12.5 mg by mouth every morning.         . risperiDONE (RISPERDAL) 0.25 MG tablet   Oral   Take 0.25 mg by mouth 2 (two) times daily.         . risperiDONE (RISPERDAL) 0.5 MG tablet   Oral   Take 0.5 mg by mouth every 8 (eight) hours as needed (agitation).           BP 143/72  Pulse 89  Temp(Src) 98.3 F (36.8 C) (Oral)  Resp 24  SpO2 95% Physical Exam  Nursing note and vitals reviewed. Constitutional: He appears well-developed and well-nourished. No distress.  HENT:  Head: Normocephalic and atraumatic.  Eyes:  Patient would not open eyes for this provider to assess  Neck: Normal range of motion. Neck supple.  Cardiovascular: Normal rate, regular rhythm and normal heart sounds.  Exam reveals no friction rub.   No murmur heard. Pulses:      Radial pulses are 2+ on the right side, and 2+ on the left side.       Dorsalis pedis pulses are 2+ on the right side, and 2+ on the left side.  Cap refill < 3 seconds Mild swelling to the ankles and dorsal aspect of the feet bilaterally, 1+ pitting edema noted  Pulmonary/Chest: Effort normal.  Expiratory wheezes noted to the left basilar lobe Rhonchi noted to the right middle and lower lobe Negative respiratory distress Negative use of accessory muscles  Abdominal: Soft. Bowel sounds are normal. He exhibits no distension. There is no tenderness.  Neurological: He is alert.  Did not follow command accordingly - patient would not open eyes during examination.   Skin: Skin is warm and dry. No rash noted. He is not diaphoretic. No erythema.  Contusion and ecchymosis noted to the left hand.   Psychiatric:   Patient appeared irritated    ED Course  Procedures (including critical care time)  This provider reviewed patient's chart. Patient as seen in the ED on 11/27/2012 - patient had a fall and was assessed. Chest xray negative for acute cardiopulmonary disease. Left hand contusion noted with no  evidence of fracture. Left elbow skin tear without complication noted. Negative acute cardiac issues noted. Patient was discharged.  Patient has history of atrial fibrillation, is seen by cardiologist and followed on a regular basis. Patient has not been started on any form of anticoagulants do to fall risk.  11/30/2012 12:37 AM Spoke with Dr. Jenean Lindau regarding case. Attending saw and assessed patient. Physician cleared patient for discharge.    Date: 11/29/2012  Rate: 93  Rhythm: atrial fibrillation  QRS Axis: normal  Intervals: normal  ST/T Wave abnormalities: normal  Conduction Disutrbances:none  Narrative Interpretation:   Old EKG Reviewed: unchanged EKG analyzed and reviewed by this provider and attending physician.   Labs Review Labs Reviewed  CBC WITH DIFFERENTIAL - Abnormal; Notable for the following:    RBC 3.81 (*)    Hemoglobin 11.5 (*)    HCT 34.0 (*)    All other components within normal limits  COMPREHENSIVE METABOLIC PANEL - Abnormal; Notable for the following:    Sodium 133 (*)    Glucose, Bld 153 (*)    Albumin 3.1 (*)    AST 53 (*)    ALT 56 (*)    GFR calc non Af Amer 61 (*)    GFR calc Af Amer 71 (*)    All other components within normal limits  URINALYSIS, ROUTINE W REFLEX MICROSCOPIC - Abnormal; Notable for the following:    Specific Gravity, Urine 1.031 (*)    All other components within normal limits  POCT I-STAT TROPONIN I   Imaging Review Dg Chest 2 View  11/29/2012   CLINICAL DATA:  Cough.  EXAM: CHEST  2 VIEW  COMPARISON:  11/27/2012  FINDINGS: Heart upper limits normal. COPD with hyperinflation and coarsened interstitial markings. . No active  infiltrates or failure. Negative osseous structures. Similar appearance to priors.  IMPRESSION: No active cardiopulmonary disease. COPD.   Electronically Signed   By: Davonna Belling M.D.   On: 11/29/2012 23:26    EKG Interpretation   None       MDM   1. Cough   2. COPD (chronic obstructive pulmonary disease)   3. Dementia   4. CHF (congestive heart failure)    Patient presenting to the ED with cough that has been ongoing for the past 2 weeks - family reported that patient's coughing has been worsening. Patient was recently seen in the emergency department on 11/27/2012 for a fall that occurred-left hand x-[place] was performed with negative findings for acute abnormalities and fractures. Chest x-Boykin was performed with negative acute abnormalities identified, COPD changes identified. Patient found laying in bed. Do to dementia patient was unable to respond to questions appropriately. Patient followed commands every once in a while, not fully cooperating. Patient would not open her eyes for eye examination normal the open up his mouth for mouth examination. Heart rate and rhythm normal. Left basilar ear lobe with expiratory wheezes identified, right middle and basilar lobe noted rhonchi. Pulses palpable and strong, proximal and distal. EKG negative for ischemic changes or new findings EKG identified. Troponin negative elevation. CBC negative elevation of white blood cell count, negative leukocytosis identified. CMP mildly low sodium, 133. Urine negative for infection, negative nitrites or leukocytosis identified. Chest x-Kebin negative for acute cardiopulmonary disease, COPD changes identified. Discussed case with Dr. Jenean Lindau - patient seen and assessed by Dr. Jenean Lindau. Attending physician cleared patient for discharge.  Negative findings for pneumonia. Negative opacities identified. Patient stable, afebrile, not septic appearing. Suspicion for  cough to be do to possible COPD exacerbation-educated family  on COPD. Discussed with family that patient is to continue taking antibiotics, doxycycline, that assisted living facility physician prescribed. Discussed with family that patient is to stick with a diet that consisted of soft, smooth foods. Discussed with family and recommended that a swallow test be performed to assess patient's ability to swallow. Referred patient to pulmonology as outpatient regarding COPD-discussed that the most important aspect of COPD patient is monitoring pulse oximetry, discussed that worsening of COPD can lead to patient being placed on oxygen therapy on a daily basis. Recommended that patient be propped up to approximately 45 while sleeping. Discussed with family to closely monitor symptoms and if symptoms are to worsen or change report back to emergency department immediately-instructed family on what symptoms to watch out for-strict return instructions given. Discharge paperwork given to both patient and to son.  Family agreed to plan of care, understood, all questions answered.    Raymon Mutton, PA-C 12/01/12 1348

## 2012-11-30 NOTE — ED Notes (Signed)
[facility] paged. 

## 2012-12-06 ENCOUNTER — Institutional Professional Consult (permissible substitution): Payer: PRIVATE HEALTH INSURANCE | Admitting: Internal Medicine

## 2012-12-07 NOTE — ED Provider Notes (Signed)
Medical screening examination/treatment/procedure(s) were conducted as a shared visit with non-physician practitioner(s) and myself.  I personally evaluated the patient during the encounter.  Pt with ongoing cough, no fever, concern from nursing home with possible aspiration.  Unremarkable w/u here, exam shows poor cough, upper airway noise, needs outpatient eval for swallow study  EKG Interpretation     Ventricular Rate:  93 PR Interval:    QRS Duration: 108 QT Interval:  370 QTC Calculation: 461 R Axis:   -29 Text Interpretation:  ED PHYSICIAN INTERPRETATION AVAILABLE IN [facility]             Olivia Mackie, MD 12/07/12 [phone]

## 2012-12-08 ENCOUNTER — Ambulatory Visit: Payer: PRIVATE HEALTH INSURANCE | Admitting: Physician Assistant

## 2012-12-12 ENCOUNTER — Emergency Department (HOSPITAL_COMMUNITY): Payer: Medicare Other

## 2012-12-12 ENCOUNTER — Emergency Department (HOSPITAL_COMMUNITY)
Admission: EM | Admit: 2012-12-12 | Discharge: 2012-12-12 | Disposition: A | Discharge status: Home / Self Care | Payer: Medicare Other | Attending: Emergency Medicine | Admitting: Emergency Medicine

## 2012-12-12 ENCOUNTER — Encounter (HOSPITAL_COMMUNITY): Payer: Self-pay | Admitting: Emergency Medicine

## 2012-12-12 DIAGNOSIS — Z85038 Personal history of other malignant neoplasm of large intestine: Secondary | ICD-10-CM | POA: Insufficient documentation

## 2012-12-12 DIAGNOSIS — I10 Essential (primary) hypertension: Secondary | ICD-10-CM | POA: Insufficient documentation

## 2012-12-12 DIAGNOSIS — Z8719 Personal history of other diseases of the digestive system: Secondary | ICD-10-CM | POA: Insufficient documentation

## 2012-12-12 DIAGNOSIS — Z79899 Other long term (current) drug therapy: Secondary | ICD-10-CM | POA: Insufficient documentation

## 2012-12-12 DIAGNOSIS — W19XXXA Unspecified fall, initial encounter: Secondary | ICD-10-CM

## 2012-12-12 DIAGNOSIS — I509 Heart failure, unspecified: Secondary | ICD-10-CM | POA: Insufficient documentation

## 2012-12-12 DIAGNOSIS — Z792 Long term (current) use of antibiotics: Secondary | ICD-10-CM | POA: Insufficient documentation

## 2012-12-12 DIAGNOSIS — Z862 Personal history of diseases of the blood and blood-forming organs and certain disorders involving the immune mechanism: Secondary | ICD-10-CM | POA: Insufficient documentation

## 2012-12-12 DIAGNOSIS — W050XXA Fall from non-moving wheelchair, initial encounter: Secondary | ICD-10-CM | POA: Insufficient documentation

## 2012-12-12 DIAGNOSIS — S0003XA Contusion of scalp, initial encounter: Secondary | ICD-10-CM | POA: Insufficient documentation

## 2012-12-12 DIAGNOSIS — Y929 Unspecified place or not applicable: Secondary | ICD-10-CM | POA: Insufficient documentation

## 2012-12-12 DIAGNOSIS — Z8639 Personal history of other endocrine, nutritional and metabolic disease: Secondary | ICD-10-CM | POA: Insufficient documentation

## 2012-12-12 DIAGNOSIS — S0093XA Contusion of unspecified part of head, initial encounter: Secondary | ICD-10-CM

## 2012-12-12 DIAGNOSIS — I2510 Atherosclerotic heart disease of native coronary artery without angina pectoris: Secondary | ICD-10-CM | POA: Insufficient documentation

## 2012-12-12 DIAGNOSIS — Z7982 Long term (current) use of aspirin: Secondary | ICD-10-CM | POA: Insufficient documentation

## 2012-12-12 DIAGNOSIS — Y939 Activity, unspecified: Secondary | ICD-10-CM | POA: Insufficient documentation

## 2012-12-12 LAB — COMPREHENSIVE METABOLIC PANEL
AST: 88 U/L — ABNORMAL HIGH (ref 0–37)
Albumin: 3.3 g/dL — ABNORMAL LOW (ref 3.5–5.2)
Alkaline Phosphatase: 69 U/L (ref 39–117)
BUN: 21 mg/dL (ref 6–23)
CO2: 27 mEq/L (ref 19–32)
Chloride: 99 mEq/L (ref 96–112)
GFR calc Af Amer: 60 mL/min — ABNORMAL LOW (ref >90)
Potassium: 3.5 mEq/L (ref 3.5–5.1)
Total Bilirubin: 0.9 mg/dL (ref 0.3–1.2)

## 2012-12-12 LAB — CBC WITH DIFFERENTIAL/PLATELET
Basophils Absolute: 0 10*3/uL (ref 0.0–0.1)
Basophils Relative: 0 % (ref 0–1)
Hemoglobin: 13.1 g/dL (ref 13.0–17.0)
Lymphocytes Relative: 13 % (ref 12–46)
MCHC: 34.6 g/dL (ref 30.0–36.0)
Monocytes Relative: 12 % (ref 3–12)
Neutro Abs: 10 10*3/uL — ABNORMAL HIGH (ref 1.7–7.7)
Neutrophils Relative %: 74 % (ref 43–77)
Platelets: 352 10*3/uL (ref 150–400)
RBC: 4.27 MIL/uL (ref 4.22–5.81)
WBC: 13.6 10*3/uL — ABNORMAL HIGH (ref 4.0–10.5)

## 2012-12-12 LAB — URINALYSIS W MICROSCOPIC + REFLEX CULTURE
Glucose, UA: NEGATIVE mg/dL
Hgb urine dipstick: NEGATIVE
Ketones, ur: NEGATIVE mg/dL
Leukocytes, UA: NEGATIVE
Protein, ur: NEGATIVE mg/dL
Urobilinogen, UA: 0.2 mg/dL (ref 0.0–1.0)

## 2012-12-12 NOTE — Progress Notes (Signed)
CSW consulted at bedside with family concerning some concerns about patient's ADLs not being met at his facility.  CSW encouraged family to speak with administration and staff about their concerns.  Family agreed that they would do that first.  CSW also provided family with information on the family with information concerning the [facility] should they feel that their concerns were not resolved.  Family thanked CSW for concern.  Marva Panda, Theresia Majors  [phone]  .12/12/2012

## 2012-12-12 NOTE — ED Notes (Signed)
Per EMS pt from [facility], pt fell about 45 minutes ago out of his wheelchair onto carpet, placed back in wheelchair by staff, small hematoma to R forehead, pt not complaining about any pain or anything else, hx of dementia.

## 2012-12-12 NOTE — ED Provider Notes (Signed)
CSN: [id number]     Arrival date & time 12/12/12  1330 History   First MD Initiated Contact with Patient 12/12/12 1407     Chief Complaint  Patient presents with  . Fall   (Consider location/radiation/quality/duration/timing/severity/associated sxs/prior Treatment) Patient is a 79 y.o. male presenting with fall. The history is provided by the patient and the EMS personnel.  Fall This is a recurrent problem. The current episode started less than 1 hour ago. The problem occurs every several days. The problem has been resolved. Pertinent negatives include no chest pain, no abdominal pain, no headaches and no shortness of breath. Nothing aggravates the symptoms. Nothing relieves the symptoms. He has tried nothing for the symptoms. The treatment provided no relief.    Past Medical History  Diagnosis Date  . CAD (coronary artery disease)     Catheterization September, 2009, total RCA, left to right collaterals, 90% diagonal, EF 10-15%  /   EF improved clinically over time  . Cardiomyopathy     EF 10-15% September, 2009  /  improved, echo, November, 2009, EF 50%,  posterior hypokinesis  . Congestive heart failure     class II to III  . GERD (gastroesophageal reflux disease)   . History of ulcerative colitis   . Bradycardia   . Hyperlipidemia   . Hypertension   . Malignancy     colonic  . Chronotropic incompetence     Assess by Dr. Graciela Husbands 2009, treadmill September, 2009 maximal heart rate 88  /   treadmill December, 2009, maximum heart rate 103, no further evaluation  . Ejection fraction     EF 10-15%, September, 2009  / iimproved, EF 50%, echo, November, 2009  /   EF 55%, nuclear, January, 2012   Past Surgical History  Procedure Laterality Date  . Tonsillectomy    . Colectomy     No family history on file. History  Substance Use Topics  . Smoking status: Never Smoker   . Smokeless tobacco: Not on file  . Alcohol Use: Yes     Comment: occasional    Review of Systems   Constitutional: Negative for fever.  HENT: Negative for drooling and rhinorrhea.   Eyes: Negative for pain.  Respiratory: Negative for cough and shortness of breath.   Cardiovascular: Negative for chest pain and leg swelling.  Gastrointestinal: Negative for nausea, vomiting, abdominal pain and diarrhea.  Genitourinary: Negative for dysuria and hematuria.  Musculoskeletal: Negative for gait problem and neck pain.  Skin: Negative for color change.  Neurological: Negative for numbness and headaches.  Hematological: Negative for adenopathy.  Psychiatric/Behavioral: Negative for behavioral problems.  All other systems reviewed and are negative.    Allergies  Review of patient's allergies indicates no known allergies.  Home Medications   Current Outpatient Rx  Name  Route  Sig  Dispense  Refill  . acetaminophen (TYLENOL) 325 MG tablet   Oral   Take 650 mg by mouth every 6 (six) hours as needed for pain.         Marland Kitchen aspirin (ASPIRIN ADULT LOW STRENGTH) 81 MG EC tablet   Oral   Take 81 mg by mouth every morning.          . B Complex-C (B-COMPLEX WITH VITAMIN C) tablet   Oral   Take 1 tablet by mouth every morning.         . carvedilol (COREG) 3.125 MG tablet   Oral   Take 3.125 mg by mouth 2 (two) times  daily with a meal.         . citalopram (CELEXA) 20 MG tablet   Oral   Take 20 mg by mouth every morning.         . digoxin (LANOXIN) 0.125 MG tablet   Oral   Take 0.125 mg by mouth every morning.         Marland Kitchen doxycycline (VIBRA-TABS) 100 MG tablet   Oral   Take 100 mg by mouth 2 (two) times daily.         . furosemide (LASIX) 20 MG tablet   Oral   Take 20 mg by mouth daily.         Marland Kitchen guaifenesin (ROBITUSSIN) 100 MG/5ML syrup   Oral   Take 200 mg by mouth 4 (four) times daily as needed for cough.         Marland Kitchen lisinopril (PRINIVIL,ZESTRIL) 5 MG tablet   Oral   Take 5 mg by mouth every morning.         . permethrin (ELIMITE) 5 % cream   Topical    Apply 1 application topically at bedtime.         Marland Kitchen QUEtiapine (SEROQUEL) 100 MG tablet   Oral   Take 100 mg by mouth at bedtime.         Marland Kitchen QUEtiapine (SEROQUEL) 25 MG tablet   Oral   Take 12.5 mg by mouth every morning.         . risperiDONE (RISPERDAL) 0.25 MG tablet   Oral   Take 0.25 mg by mouth 2 (two) times daily.         . risperiDONE (RISPERDAL) 0.5 MG tablet   Oral   Take 0.5 mg by mouth every 8 (eight) hours as needed (agitation).           BP 121/59  Pulse 77  Temp(Src) 98.2 F (36.8 C) (Oral)  Resp 20  SpO2 94% Physical Exam  Nursing note and vitals reviewed. Constitutional: He is oriented to person, place, and time. He appears well-developed and well-nourished.  HENT:  Head: Normocephalic and atraumatic.  Right Ear: External ear normal.  Left Ear: External ear normal.  Nose: Nose normal.  Mouth/Throat: Oropharynx is clear and moist. No oropharyngeal exudate.  Small contusion w/ mild swelling to right mid parietal area.  Eyes: Conjunctivae and EOM are normal. Pupils are equal, round, and reactive to light.  Neck: Normal range of motion. Neck supple.  No vertebral ttp.   Cardiovascular: Normal rate, regular rhythm, normal heart sounds and intact distal pulses.  Exam reveals no gallop and no friction rub.   No murmur heard. Pulmonary/Chest: Effort normal and breath sounds normal. No respiratory distress. He has no wheezes.  Abdominal: Soft. Bowel sounds are normal. He exhibits no distension. There is no tenderness. There is no rebound and no guarding.  Musculoskeletal: Normal range of motion. He exhibits no edema and no tenderness.  Old skin tear to dorsum of left hand which is held together w/ steri-strips. No erythema, redness, or evidence of infection.   Neurological: He is alert and oriented to person, place, and time.  Skin: Skin is warm and dry.  Psychiatric: He has a normal mood and affect. His behavior is normal.    ED Course  Procedures  (including critical care time) Labs Review Labs Reviewed  CBC WITH DIFFERENTIAL - Abnormal; Notable for the following:    WBC 13.6 (*)    HCT 37.9 (*)    Neutro Abs  10.0 (*)    Monocytes Absolute 1.7 (*)    All other components within normal limits  COMPREHENSIVE METABOLIC PANEL - Abnormal; Notable for the following:    Glucose, Bld 131 (*)    Albumin 3.3 (*)    AST 88 (*)    GFR calc non Af Amer 51 (*)    GFR calc Af Amer 60 (*)    All other components within normal limits  URINALYSIS W MICROSCOPIC + REFLEX CULTURE   Imaging Review Dg Chest 2 View  12/12/2012   CLINICAL DATA:  Fall. Dementia.  EXAM: CHEST  2 VIEW  COMPARISON:  11/29/2012.  FINDINGS: Prominent nipple shadows are present at the lung bases. Heart size within normal limits for projection. Monitoring leads project over the chest. No airspace disease. No effusion. Bilateral pleural apical scarring appears similar.  IMPRESSION: No active cardiopulmonary disease.   Electronically Signed   By: Andreas Newport M.D.   On: 12/12/2012 15:36   Dg Pelvis 1-2 Views  12/12/2012   CLINICAL DATA:  Larey Seat.  Pelvic pain.  EXAM: PELVIS - 1-2 VIEW  COMPARISON:  None.  FINDINGS: The hips are normally located. No acute fracture is identified. No plain film findings for avascular necrosis. The pubic symphysis and SI joints are intact. No definite pelvic fractures. Extensive vascular calcifications are noted.  IMPRESSION: No acute bony findings.   Electronically Signed   By: Loralie Champagne M.D.   On: 12/12/2012 15:54   Ct Head Wo Contrast  12/12/2012   CLINICAL DATA:  Recent traumatic injury  EXAM: CT HEAD WITHOUT CONTRAST  TECHNIQUE: Contiguous axial images were obtained from the base of the skull through the vertex without intravenous contrast.  COMPARISON:  None.  FINDINGS: The bony calvarium is intact. No gross soft tissue abnormality is noted. Atrophic changes are noted. There is again noted enlargement of the temporal horn and atrium of the  right lateral ventricle.No findings to suggest acute hemorrhage, acute infarction or space-occupying mass lesion are noted. No new focal abnormality is seen.  IMPRESSION: Chronic changes without acute abnormality.   Electronically Signed   By: Alcide Clever M.D.   On: 12/12/2012 15:16    EKG Interpretation     Ventricular Rate:  71 PR Interval:    QRS Duration: 116 QT Interval:  395 QTC Calculation: 429 R Axis:   -23 Text Interpretation:  Atrial fibrillation Nonspecific intraventricular conduction delay Baseline wander in lead(s) V1 V3 Non-specific ST wave changes, now more prominent in V5-V6            MDM   1. Fall, initial encounter   2. Contusion of head, initial encounter    2:32 PM 79 y.o. male here from [facility] after he fell from his wheelchair onto carpet approximately one hour ago. The patient has no complaints on exam. He does have a history of dementia. He is been seen here several times recently for similar issues with falling. He is afebrile and vital signs are unremarkable here today. Will get screening labwork and imaging.  4:37 PM: Mild leukocytosis which is non-specific. I interpreted/reviewed the labs and/or imaging which were non-contributory.   I have discussed the diagnosis/risks/treatment options with the patient and family and believe the pt to be eligible for discharge home to follow-up with pcp as needed. We also discussed returning to the ED immediately if new or worsening sx occur. We discussed the sx which are most concerning (e.g., further falls) that necessitate immediate return. Any new  prescriptions provided to the patient are listed below.  New Prescriptions   No medications on file      Junius Argyle, MD 12/12/12 [phone]

## 2012-12-12 NOTE — ED Notes (Signed)
Bed: WA08 Expected date:  Expected time:  Means of arrival:  Comments: 79 y/o M fall

## 2013-05-31 ENCOUNTER — Emergency Department (HOSPITAL_COMMUNITY): Payer: Medicare Other

## 2013-05-31 ENCOUNTER — Emergency Department (HOSPITAL_COMMUNITY)
Admission: EM | Admit: 2013-05-31 | Discharge: 2013-06-01 | Disposition: A | Discharge status: Home / Self Care | Payer: Medicare Other | Attending: Emergency Medicine | Admitting: Emergency Medicine

## 2013-05-31 ENCOUNTER — Encounter (HOSPITAL_COMMUNITY): Payer: Self-pay | Admitting: Emergency Medicine

## 2013-05-31 DIAGNOSIS — I509 Heart failure, unspecified: Secondary | ICD-10-CM | POA: Insufficient documentation

## 2013-05-31 DIAGNOSIS — Z8719 Personal history of other diseases of the digestive system: Secondary | ICD-10-CM | POA: Insufficient documentation

## 2013-05-31 DIAGNOSIS — Z862 Personal history of diseases of the blood and blood-forming organs and certain disorders involving the immune mechanism: Secondary | ICD-10-CM | POA: Insufficient documentation

## 2013-05-31 DIAGNOSIS — Z8639 Personal history of other endocrine, nutritional and metabolic disease: Secondary | ICD-10-CM | POA: Insufficient documentation

## 2013-05-31 DIAGNOSIS — Z7982 Long term (current) use of aspirin: Secondary | ICD-10-CM | POA: Insufficient documentation

## 2013-05-31 DIAGNOSIS — R5381 Other malaise: Secondary | ICD-10-CM | POA: Insufficient documentation

## 2013-05-31 DIAGNOSIS — Z79899 Other long term (current) drug therapy: Secondary | ICD-10-CM | POA: Insufficient documentation

## 2013-05-31 DIAGNOSIS — I2510 Atherosclerotic heart disease of native coronary artery without angina pectoris: Secondary | ICD-10-CM | POA: Insufficient documentation

## 2013-05-31 DIAGNOSIS — Z85038 Personal history of other malignant neoplasm of large intestine: Secondary | ICD-10-CM | POA: Insufficient documentation

## 2013-05-31 DIAGNOSIS — R5383 Other fatigue: Secondary | ICD-10-CM

## 2013-05-31 DIAGNOSIS — I10 Essential (primary) hypertension: Secondary | ICD-10-CM | POA: Insufficient documentation

## 2013-05-31 DIAGNOSIS — F0390 Unspecified dementia without behavioral disturbance: Secondary | ICD-10-CM

## 2013-05-31 LAB — CBC WITH DIFFERENTIAL/PLATELET
BASOS ABS: 0.1 10*3/uL (ref 0.0–0.1)
Basophils Relative: 1 % (ref 0–1)
Eosinophils Absolute: 0.7 10*3/uL (ref 0.0–0.7)
Eosinophils Relative: 10 % — ABNORMAL HIGH (ref 0–5)
HCT: 33.4 % — ABNORMAL LOW (ref 39.0–52.0)
Hemoglobin: 11.2 g/dL — ABNORMAL LOW (ref 13.0–17.0)
LYMPHS PCT: 20 % (ref 12–46)
Lymphs Abs: 1.4 10*3/uL (ref 0.7–4.0)
MCH: 31.1 pg (ref 26.0–34.0)
MCHC: 33.5 g/dL (ref 30.0–36.0)
MCV: 92.8 fL (ref 78.0–100.0)
Monocytes Absolute: 1 10*3/uL (ref 0.1–1.0)
Monocytes Relative: 14 % — ABNORMAL HIGH (ref 3–12)
Neutro Abs: 4 10*3/uL (ref 1.7–7.7)
Neutrophils Relative %: 56 % (ref 43–77)
PLATELETS: 216 10*3/uL (ref 150–400)
RBC: 3.6 MIL/uL — AB (ref 4.22–5.81)
RDW: 13.1 % (ref 11.5–15.5)
WBC: 7.2 10*3/uL (ref 4.0–10.5)

## 2013-05-31 LAB — COMPREHENSIVE METABOLIC PANEL
ALK PHOS: 78 U/L (ref 39–117)
ALT: 12 U/L (ref 0–53)
AST: 14 U/L (ref 0–37)
Albumin: 3.3 g/dL — ABNORMAL LOW (ref 3.5–5.2)
BUN: 22 mg/dL (ref 6–23)
CHLORIDE: 102 meq/L (ref 96–112)
CO2: 27 meq/L (ref 19–32)
Calcium: 8.8 mg/dL (ref 8.4–10.5)
Creatinine, Ser: 1.18 mg/dL (ref 0.50–1.35)
GFR, EST AFRICAN AMERICAN: 62 mL/min — AB (ref >90)
GFR, EST NON AFRICAN AMERICAN: 53 mL/min — AB (ref >90)
GLUCOSE: 117 mg/dL — AB (ref 70–99)
POTASSIUM: 4.3 meq/L (ref 3.7–5.3)
SODIUM: 140 meq/L (ref 137–147)
Total Bilirubin: 0.3 mg/dL (ref 0.3–1.2)
Total Protein: 6.2 g/dL (ref 6.0–8.3)

## 2013-05-31 LAB — URINALYSIS, ROUTINE W REFLEX MICROSCOPIC
Bilirubin Urine: NEGATIVE
GLUCOSE, UA: NEGATIVE mg/dL
Hgb urine dipstick: NEGATIVE
KETONES UR: NEGATIVE mg/dL
LEUKOCYTES UA: NEGATIVE
Nitrite: NEGATIVE
PH: 6.5 (ref 5.0–8.0)
PROTEIN: NEGATIVE mg/dL
Specific Gravity, Urine: 1.008 (ref 1.005–1.030)
Urobilinogen, UA: 0.2 mg/dL (ref 0.0–1.0)

## 2013-05-31 LAB — AMMONIA: AMMONIA: 23 umol/L (ref 11–60)

## 2013-05-31 LAB — PROTIME-INR
INR: 1.09 (ref 0.00–1.49)
Prothrombin Time: 13.9 seconds (ref 11.6–15.2)

## 2013-05-31 MED ORDER — SODIUM CHLORIDE 0.9 % IV SOLN
1000.0000 mL | INTRAVENOUS | Status: DC
  Administered 2013-05-31: 1000 mL via INTRAVENOUS

## 2013-05-31 NOTE — ED Notes (Signed)
Pt's son: Siler Mavis: [phone]
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
## 2013-06-01 NOTE — Discharge Instructions (Signed)
Dementia Dementia is a general term for problems with brain function. A person with dementia has memory loss and a hard time with at least one other brain function such as thinking, speaking, or problem solving. Dementia can affect social functioning, how you do your job, your mood, or your personality. The changes may be hidden for a long time. The earliest forms of this disease are usually not detected by family or friends. Dementia can be:  Irreversible.  Potentially reversible.  Partially reversible.  Progressive. This means it can get worse over time. CAUSES  Irreversible dementia causes may include:  Degeneration of brain cells (Alzheimer's disease or lewy body dementia).  Multiple small strokes (vascular dementia).  Infection (chronic meningitis or Creutzfelt-Jakob disease).  Frontotemporal dementia. This affects younger people, age 40 to 70, compared to those who have Alzheimer's disease.  Dementia associated with other disorders like Parkinson's disease, Huntington's disease, or HIV-associated dementia. Potentially or partially reversible dementia causes may include:  Medicines.  Metabolic causes such as excessive alcohol intake, vitamin B12 deficiency, or thyroid disease.  Masses or pressure in the brain such as a tumor, blood clot, or hydrocephalus. SYMPTOMS  Symptoms are often hard to detect. Family members or coworkers may not notice them early in the disease process. Different people with dementia may have different symptoms. Symptoms can include:  A hard time with memory, especially recent memory. Long-term memory may not be impaired.  Asking the same question multiple times or forgetting something someone just said.  A hard time speaking your thoughts or finding certain words.  A hard time solving problems or performing familiar tasks (such as how to use a telephone).  Sudden changes in mood.  Changes in personality, especially increasing moodiness or  mistrust.  Depression.  A hard time understanding complex ideas that were never a problem in the past. DIAGNOSIS  There are no specific tests for dementia.   Your caregiver may recommend a thorough evaluation. This is because some forms of dementia can be reversible. The evaluation will likely include a physical exam and getting a detailed history from you and a family member. The history often gives the best clues and suggestions for a diagnosis.  Memory testing may be done. A detailed brain function evaluation called neuropsychologic testing may be helpful.  Lab tests and brain imaging (such as a CT scan or MRI scan) are sometimes important.  Sometimes observation and re-evaluation over time is very helpful. TREATMENT  Treatment depends on the cause.   If the problem is a vitamin deficiency, it may be helped or cured with supplements.  For dementias such as Alzheimer's disease, medicines are available to stabilize or slow the course of the disease. There are no cures for this type of dementia.  Your caregiver can help direct you to groups, organizations, and other caregivers to help with decisions in the care of you or your loved one. HOME CARE INSTRUCTIONS The care of individuals with dementia is varied and dependent upon the progression of the dementia. The following suggestions are intended for the person living with, or caring for, the person with dementia.  Create a safe environment.  Remove the locks on bathroom doors to prevent the person from accidentally locking himself or herself in.  Use childproof latches on kitchen cabinets and any place where cleaning supplies, chemicals, or alcohol are kept.  Use childproof covers in unused electrical outlets.  Install childproof devices to keep doors and windows secured.  Remove stove knobs or install safety   knobs and an automatic shut-off on the stove.  Lower the temperature on water heaters.  Label medicines and keep them  locked up.  Secure knives, lighters, matches, power tools, and guns, and keep these items out of reach.  Keep the house free from clutter. Remove rugs or anything that might contribute to a fall.  Remove objects that might break and hurt the person.  Make sure lighting is good, both inside and outside.  Install grab rails as needed.  Use a monitoring device to alert you to falls or other needs for help.  Reduce confusion.  Keep familiar objects and people around.  Use night lights or dim lights at night.  Label items or areas.  Use reminders, notes, or directions for daily activities or tasks.  Keep a simple, consistent routine for waking, meals, bathing, dressing, and bedtime.  Create a calm, quiet environment.  Place large clocks and calendars prominently.  Display emergency numbers and home address near all telephones.  Use cues to establish different times of the day. An example is to open curtains to let the natural light in during the day.   Use effective communication.  Choose simple words and short sentences.  Use a gentle, calm tone of voice.  Be careful not to interrupt.  If the person is struggling to find a word or communicate a thought, try to provide the word or thought.  Ask one question at a time. Allow the person ample time to answer questions. Repeat the question again if the person does not respond.  Reduce nighttime restlessness.  Provide a comfortable bed.  Have a consistent nighttime routine.  Ensure a regular walking or physical activity schedule. Involve the person in daily activities as much as possible.  Limit napping during the day.  Limit caffeine.  Attend social events that stimulate rather than overwhelm the senses.  Encourage good nutrition and hydration.  Reduce distractions during meal times and snacks.  Avoid foods that are too hot or too cold.  Monitor chewing and swallowing ability.  Continue with routine vision,  hearing, dental, and medical screenings.  Only give over-the-counter or prescription medicines as directed by the caregiver.  Monitor driving abilities. Do not allow the person to drive when safe driving is no longer possible.  Register with an identification program which could provide location assistance in the event of a missing person situation. SEEK MEDICAL CARE IF:   New behavioral problems start such as moodiness, aggressiveness, or seeing things that are not there (hallucinations).  Any new problem with brain function happens. This includes problems with balance, speech, or falling a lot.  Problems with swallowing develop.  Any symptoms of other illness happen. Small changes or worsening in any aspect of brain function can be a sign that the illness is getting worse. It can also be a sign of another medical illness such as infection. Seeing a caregiver right away is important. SEEK IMMEDIATE MEDICAL CARE IF:   A fever develops.  New or worsened confusion develops.  New or worsened sleepiness develops.  Staying awake becomes hard to do. Document Released: 07/29/2000 Document Revised: 04/27/2011 Document Reviewed: 06/30/2010 Exit[employer] Patient Information 2014 [employer].  

## 2013-10-26 ENCOUNTER — Emergency Department (HOSPITAL_COMMUNITY)
Admission: EM | Admit: 2013-10-26 | Discharge: 2013-10-26 | Disposition: A | Discharge status: Home / Self Care | Payer: Medicare Other | Attending: Emergency Medicine | Admitting: Emergency Medicine

## 2013-10-26 ENCOUNTER — Encounter (HOSPITAL_COMMUNITY): Payer: Self-pay | Admitting: Emergency Medicine

## 2013-10-26 ENCOUNTER — Emergency Department (HOSPITAL_COMMUNITY): Payer: Medicare Other

## 2013-10-26 DIAGNOSIS — W07XXXA Fall from chair, initial encounter: Secondary | ICD-10-CM | POA: Diagnosis not present

## 2013-10-26 DIAGNOSIS — Z87448 Personal history of other diseases of urinary system: Secondary | ICD-10-CM | POA: Insufficient documentation

## 2013-10-26 DIAGNOSIS — Y9389 Activity, other specified: Secondary | ICD-10-CM | POA: Insufficient documentation

## 2013-10-26 DIAGNOSIS — F0390 Unspecified dementia without behavioral disturbance: Secondary | ICD-10-CM | POA: Diagnosis not present

## 2013-10-26 DIAGNOSIS — S0081XA Abrasion of other part of head, initial encounter: Secondary | ICD-10-CM

## 2013-10-26 DIAGNOSIS — S0190XA Unspecified open wound of unspecified part of head, initial encounter: Secondary | ICD-10-CM | POA: Insufficient documentation

## 2013-10-26 DIAGNOSIS — I2510 Atherosclerotic heart disease of native coronary artery without angina pectoris: Secondary | ICD-10-CM | POA: Insufficient documentation

## 2013-10-26 DIAGNOSIS — Z862 Personal history of diseases of the blood and blood-forming organs and certain disorders involving the immune mechanism: Secondary | ICD-10-CM | POA: Diagnosis not present

## 2013-10-26 DIAGNOSIS — Z9089 Acquired absence of other organs: Secondary | ICD-10-CM | POA: Diagnosis not present

## 2013-10-26 DIAGNOSIS — K219 Gastro-esophageal reflux disease without esophagitis: Secondary | ICD-10-CM | POA: Diagnosis not present

## 2013-10-26 DIAGNOSIS — S5000XA Contusion of unspecified elbow, initial encounter: Secondary | ICD-10-CM | POA: Insufficient documentation

## 2013-10-26 DIAGNOSIS — Y9210 Unspecified residential institution as the place of occurrence of the external cause: Secondary | ICD-10-CM | POA: Insufficient documentation

## 2013-10-26 DIAGNOSIS — Z79899 Other long term (current) drug therapy: Secondary | ICD-10-CM | POA: Insufficient documentation

## 2013-10-26 DIAGNOSIS — I10 Essential (primary) hypertension: Secondary | ICD-10-CM | POA: Diagnosis not present

## 2013-10-26 DIAGNOSIS — Z7982 Long term (current) use of aspirin: Secondary | ICD-10-CM | POA: Diagnosis not present

## 2013-10-26 DIAGNOSIS — Z85038 Personal history of other malignant neoplasm of large intestine: Secondary | ICD-10-CM | POA: Diagnosis not present

## 2013-10-26 DIAGNOSIS — IMO0002 Reserved for concepts with insufficient information to code with codable children: Secondary | ICD-10-CM | POA: Diagnosis not present

## 2013-10-26 DIAGNOSIS — S5001XA Contusion of right elbow, initial encounter: Secondary | ICD-10-CM

## 2013-10-26 DIAGNOSIS — S0990XA Unspecified injury of head, initial encounter: Secondary | ICD-10-CM

## 2013-10-26 DIAGNOSIS — Z8639 Personal history of other endocrine, nutritional and metabolic disease: Secondary | ICD-10-CM | POA: Insufficient documentation

## 2013-10-26 DIAGNOSIS — I509 Heart failure, unspecified: Secondary | ICD-10-CM | POA: Insufficient documentation

## 2013-10-26 HISTORY — DX: Unspecified dementia, unspecified severity, without behavioral disturbance, psychotic disturbance, mood disturbance, and anxiety: F03.90

## 2013-10-26 NOTE — ED Notes (Signed)
Per EMs. PT from [facility]. Staff report pt leaned sideways in chair and fell, hitting R side of face. Pt has hematoma beside R eye and skin tear on R elbow. Nursing reports LOC after fall. Pt acting per norm, hx of dementia. No blood thinner use

## 2013-10-26 NOTE — ED Notes (Signed)
Pts abrasion above R eye cleaned w/ NS and bacitracin placed on it

## 2013-10-26 NOTE — ED Notes (Signed)
[facility] called to transport pt back to [facility]

## 2013-10-26 NOTE — Discharge Instructions (Signed)
Contusion °A contusion is a deep bruise. Contusions are the result of an injury that caused bleeding under the skin. The contusion may turn blue, purple, or yellow. Minor injuries will give you a painless contusion, but more severe contusions may stay painful and swollen for a few weeks.  °CAUSES  °A contusion is usually caused by a blow, trauma, or direct force to an area of the body. °SYMPTOMS  °· Swelling and redness of the injured area. °· Bruising of the injured area. °· Tenderness and soreness of the injured area. °· Pain. °DIAGNOSIS  °The diagnosis can be made by taking a history and physical exam. An X-Davien, CT scan, or MRI may be needed to determine if there were any associated injuries, such as fractures. °TREATMENT  °Specific treatment will depend on what area of the body was injured. In general, the best treatment for a contusion is resting, icing, elevating, and applying cold compresses to the injured area. Over-the-counter medicines may also be recommended for pain control. Ask your caregiver what the best treatment is for your contusion. °HOME CARE INSTRUCTIONS  °· Put ice on the injured area. °· Put ice in a plastic bag. °· Place a towel between your skin and the bag. °· Leave the ice on for 15-20 minutes, 3-4 times a day, or as directed by your health care provider. °· Only take over-the-counter or prescription medicines for pain, discomfort, or fever as directed by your caregiver. Your caregiver may recommend avoiding anti-inflammatory medicines (aspirin, ibuprofen, and naproxen) for 48 hours because these medicines may increase bruising. °· Rest the injured area. °· If possible, elevate the injured area to reduce swelling. °SEEK IMMEDIATE MEDICAL CARE IF:  °· You have increased bruising or swelling. °· You have pain that is getting worse. °· Your swelling or pain is not relieved with medicines. °MAKE SURE YOU:  °· Understand these instructions. °· Will watch your condition. °· Will get help right  away if you are not doing well or get worse. °Document Released: 11/12/2004 Document Revised: 02/07/2013 Document Reviewed: 12/08/2010 °Exit[employer]® Patient Information ©2015 [employer]. This information is not intended to replace advice given to you by your health care provider. Make sure you discuss any questions you have with your health care provider. ° ° °Head Injury °You have received a head injury. It does not appear serious at this time. Headaches and vomiting are common following head injury. It should be easy to awaken from sleeping. Sometimes it is necessary for you to stay in the emergency department for a while for observation. Sometimes admission to the hospital may be needed. After injuries such as yours, most problems occur within the first 24 hours, but side effects may occur up to 7-10 days after the injury. It is important for you to carefully monitor your condition and contact your health care provider or seek immediate medical care if there is a change in your condition. °WHAT ARE THE TYPES OF HEAD INJURIES? °Head injuries can be as minor as a bump. Some head injuries can be more severe. More severe head injuries include: °· A jarring injury to the brain (concussion). °· A bruise of the brain (contusion). This mean there is bleeding in the brain that can cause swelling. °· A cracked skull (skull fracture). °· Bleeding in the brain that collects, clots, and forms a bump (hematoma). °WHAT CAUSES A HEAD INJURY? °A serious head injury is most likely to happen to someone who is in a car wreck and is not   wearing a seat belt. Other causes of major head injuries include bicycle or motorcycle accidents, sports injuries, and falls. °HOW ARE HEAD INJURIES DIAGNOSED? °A complete history of the event leading to the injury and your current symptoms will be helpful in diagnosing head injuries. Many times, pictures of the brain, such as CT or MRI are needed to see the extent of the injury. Often, an overnight  hospital stay is necessary for observation.  °WHEN SHOULD I SEEK IMMEDIATE MEDICAL CARE?  °You should get help right away if: °· You have confusion or drowsiness. °· You feel sick to your stomach (nauseous) or have continued, forceful vomiting. °· You have dizziness or unsteadiness that is getting worse. °· You have severe, continued headaches not relieved by medicine. Only take over-the-counter or prescription medicines for pain, fever, or discomfort as directed by your health care provider. °· You do not have normal function of the arms or legs or are unable to walk. °· You notice changes in the black spots in the center of the colored part of your eye (pupil). °· You have a clear or bloody fluid coming from your nose or ears. °· You have a loss of vision. °During the next 24 hours after the injury, you must stay with someone who can watch you for the warning signs. This person should contact local emergency services (911 in the [place] if you have seizures, you become unconscious, or you are unable to wake up. °HOW CAN I PREVENT A HEAD INJURY IN THE FUTURE? °The most important factor for preventing major head injuries is avoiding motor vehicle accidents.  To minimize the potential for damage to your head, it is crucial to wear seat belts while riding in motor vehicles. Wearing helmets while bike riding and playing collision sports (like football) is also helpful. Also, avoiding dangerous activities around the house will further help reduce your risk of head injury.  °WHEN CAN I RETURN TO NORMAL ACTIVITIES AND ATHLETICS? °You should be reevaluated by your health care provider before returning to these activities. If you have any of the following symptoms, you should not return to activities or contact sports until 1 week after the symptoms have stopped: °· Persistent headache. °· Dizziness or vertigo. °· Poor attention and concentration. °· Confusion. °· Memory problems. °· Nausea or vomiting. °· Fatigue or tire  easily. °· Irritability. °· Intolerant of bright lights or loud noises. °· Anxiety or depression. °· Disturbed sleep. °MAKE SURE YOU:  °· Understand these instructions. °· Will watch your condition. °· Will get help right away if you are not doing well or get worse. °Document Released: 02/02/2005 Document Revised: 02/07/2013 Document Reviewed: 10/10/2012 °ExitCare® Patient Information ©2015 [employer]. This information is not intended to replace advice given to you by your health care provider. Make sure you discuss any questions you have with your health care provider. ° °

## 2013-10-26 NOTE — ED Notes (Signed)
Bed: WA01 Expected date:  Expected time:  Means of arrival:  Comments: ems 

## 2013-10-26 NOTE — ED Notes (Signed)
[facility] called and made aware of pts diagnosis and CT results, they stated it is ok for pt to return to the facility.

## 2013-10-26 NOTE — ED Provider Notes (Signed)
CSN: [id number]     Arrival date & time 10/26/13  1726 History   First MD Initiated Contact with Patient 10/26/13 2140     Chief Complaint  Patient presents with  . Fall  . Head Laceration     (Consider location/radiation/quality/duration/timing/severity/associated sxs/prior Treatment) Patient is a 80 y.o. male presenting with fall and scalp laceration. The history is provided by the patient and the nursing home. The history is limited by a language barrier.  Fall  Head Laceration   He presents for evaluation of an injury. He lives at a skilled care facility. He was reportedly sitting in chair in toppled over, striking his head and right elbow. There is no loss of consciousness. His behavior has been usual since then. He has dementia.  Level 5 caveat- dementia  Past Medical History  Diagnosis Date  . CAD (coronary artery disease)     Catheterization September, 2009, total RCA, left to right collaterals, 90% diagonal, EF 10-15%  /   EF improved clinically over time  . Cardiomyopathy     EF 10-15% September, 2009  /  improved, echo, November, 2009, EF 50%,  posterior hypokinesis  . Congestive heart failure     class II to III  . GERD (gastroesophageal reflux disease)   . History of ulcerative colitis   . Bradycardia   . Hyperlipidemia   . Hypertension   . Chronotropic incompetence     Assess by Dr. Caryl Comes 2009, treadmill September, 2009 maximal heart rate 88  /   treadmill December, 2009, maximum heart rate 103, no further evaluation  . Ejection fraction     EF 10-15%, September, 2009  / iimproved, EF 50%, echo, November, 2009  /   EF 55%, nuclear, January, 2012  . Malignancy     colonic  . Dementia    Past Surgical History  Procedure Laterality Date  . Tonsillectomy    . Colectomy     History reviewed. No pertinent family history. History  Substance Use Topics  . Smoking status: Never Smoker   . Smokeless tobacco: Not on file  . Alcohol Use: Yes     Comment:  occasional    Review of Systems  Unable to perform ROS     Allergies  Review of patient's allergies indicates no known allergies.  Home Medications   Prior to Admission medications   Medication Sig Start Date End Date Taking? Authorizing Provider  acetaminophen (TYLENOL) 325 MG tablet Take 650 mg by mouth every 6 (six) hours as needed for pain.   Yes Historical Provider, MD  aspirin (ASPIRIN ADULT LOW STRENGTH) 81 MG EC tablet Take 81 mg by mouth every morning.    Yes Historical Provider, MD  B Complex-C (B-COMPLEX WITH VITAMIN C) tablet Take 1 tablet by mouth every morning.   Yes Historical Provider, MD  carvedilol (COREG) 3.125 MG tablet Take 3.125 mg by mouth 2 (two) times daily with a meal.   Yes Historical Provider, MD  citalopram (CELEXA) 10 MG tablet Take 10 mg by mouth daily.   Yes Historical Provider, MD  digoxin (LANOXIN) 0.125 MG tablet Take 0.125 mg by mouth every morning.   Yes Historical Provider, MD  feeding supplement (ENSURE IMMUNE HEALTH) LIQD Take 237 mLs by mouth 2 (two) times daily between meals.   Yes Historical Provider, MD  furosemide (LASIX) 20 MG tablet Take 20 mg by mouth daily.   Yes Historical Provider, MD  lisinopril (PRINIVIL,ZESTRIL) 5 MG tablet Take 5 mg by mouth  every morning.   Yes Historical Provider, MD  QUEtiapine (SEROQUEL) 25 MG tablet Take 25 mg by mouth at bedtime.   Yes Historical Provider, MD  risperiDONE (RISPERDAL) 0.25 MG tablet Take 0.25-0.5 mg by mouth 2 (two) times daily. Take 1 tablet every morning and 2 tablets every night.   Yes Historical Provider, MD  risperiDONE (RISPERDAL) 0.5 MG tablet Take 0.5 mg by mouth every 8 (eight) hours as needed (agitation).    Yes Historical Provider, MD   BP 147/69  Pulse 72  Temp(Src) 98.2 F (36.8 C) (Oral)  Resp 18  SpO2 99% Physical Exam  Nursing note and vitals reviewed. Constitutional: He appears well-developed and well-nourished.  HENT:  Head: Normocephalic.  Right Ear: External ear  normal.  Left Ear: External ear normal.  Contusion and abrasion, right, lateral orbit region, without crepitation, or deformity.  Eyes: Conjunctivae and EOM are normal. Pupils are equal, round, and reactive to light.  Neck: Normal range of motion and phonation normal. Neck supple.  Cardiovascular: Normal rate, regular rhythm, normal heart sounds and intact distal pulses.   Pulmonary/Chest: Effort normal and breath sounds normal. He exhibits no bony tenderness.  Abdominal: Soft. There is no tenderness.  Musculoskeletal: Normal range of motion.  Abrasion right elbow, no bleeding, no deformity, associated.  Neurological: He is alert. No cranial nerve deficit or sensory deficit. He exhibits normal muscle tone. Coordination normal.  Skin: Skin is warm, dry and intact.  Psychiatric: His behavior is normal.    ED Course  Procedures (including critical care time)  Medications - No data to display  Patient Vitals for the past 24 hrs:  BP Temp Temp src Pulse Resp SpO2  10/26/13 2200 147/69 mmHg - - 72 18 99 %  10/26/13 2116 152/64 mmHg - - 70 20 94 %  10/26/13 1930 163/78 mmHg - - 42 28 89 %  10/26/13 1732 159/88 mmHg 98.2 F (36.8 C) Oral 77 20 96 %     Labs Review Labs Reviewed - No data to display  Imaging Review Ct Head Wo Contrast  10/26/2013   CLINICAL DATA:  [place] today, laceration the RIGHT temple, confusion, history dementia  EXAM: CT HEAD WITHOUT CONTRAST  TECHNIQUE: Contiguous axial images were obtained from the base of the skull through the vertex without intravenous contrast.  COMPARISON:  05/31/2013; correlation MRI brain 12/24/2007  FINDINGS: Generalized atrophy.  Chronic dilatation of the temporal horn of the RIGHT lateral ventricle unchanged since 2009.  Small vessel chronic ischemic changes deep cerebral white matter.  No midline shift or mass effect.  No intracranial hemorrhage, mass lesion or evidence acute infarction.  No extra-axial fluid collections.  Atherosclerotic  calcification of internal carotid and vertebral arteries at skullbase.  Slight deformity of lateral wall of RIGHT orbit image 1, not seen on prior CT.  Soft tissue swelling/hematoma overlies the lateral aspect of the LEFT orbital wall.  No additional bone or sinus abnormalities identified.  IMPRESSION: Atrophy with small vessel chronic ischemic changes of deep cerebral white matter.  Chronic dilatation of the temporal horn of the RIGHT lateral ventricle, of uncertain etiology but stable since 2009.  No acute intracranial abnormalities.  Slight contour deformity of the lateral wall the RIGHT orbit not definitely seen on prior CT, question lateral wall RIGHT orbital fracture.   Electronically Signed   By: Lavonia Dana M.D.   On: 10/26/2013 19:51     EKG Interpretation None      MDM   Final diagnoses:  Head injury, initial encounter  Abrasion of face, initial encounter  Contusion, elbow, right, initial encounter    Mechanical fall related to poor balance. No serious injuries.  Nursing Notes Reviewed/ Care Coordinated Applicable Imaging Reviewed Interpretation of Laboratory Data incorporated into ED treatment  The patient appears reasonably screened and/or stabilized for discharge and I doubt any other medical condition or other [facility] requiring further screening, evaluation, or treatment in the ED at this time prior to discharge.  Plan: Home Medications- usual; Home Treatments- rest; return here if the recommended treatment, does not improve the symptoms; Recommended follow up- PCP prn    Richarda Blade, MD 10/27/13 [phone]

## 2013-11-03 ENCOUNTER — Inpatient Hospital Stay (HOSPITAL_COMMUNITY)
Admission: EM | Admit: 2013-11-03 | Discharge: 2013-11-07 | DRG: 194 | Disposition: A | Discharge status: Hospice | Payer: Medicare Other | Attending: Internal Medicine | Admitting: Internal Medicine

## 2013-11-03 ENCOUNTER — Encounter (HOSPITAL_COMMUNITY): Payer: Self-pay | Admitting: Emergency Medicine

## 2013-11-03 ENCOUNTER — Emergency Department (HOSPITAL_COMMUNITY): Payer: Medicare Other

## 2013-11-03 DIAGNOSIS — IMO0002 Reserved for concepts with insufficient information to code with codable children: Secondary | ICD-10-CM | POA: Diagnosis not present

## 2013-11-03 DIAGNOSIS — R001 Bradycardia, unspecified: Secondary | ICD-10-CM

## 2013-11-03 DIAGNOSIS — Z79899 Other long term (current) drug therapy: Secondary | ICD-10-CM

## 2013-11-03 DIAGNOSIS — E785 Hyperlipidemia, unspecified: Secondary | ICD-10-CM | POA: Diagnosis present

## 2013-11-03 DIAGNOSIS — I10 Essential (primary) hypertension: Secondary | ICD-10-CM | POA: Diagnosis present

## 2013-11-03 DIAGNOSIS — J189 Pneumonia, unspecified organism: Secondary | ICD-10-CM | POA: Diagnosis not present

## 2013-11-03 DIAGNOSIS — Z66 Do not resuscitate: Secondary | ICD-10-CM | POA: Diagnosis present

## 2013-11-03 DIAGNOSIS — K219 Gastro-esophageal reflux disease without esophagitis: Secondary | ICD-10-CM | POA: Diagnosis present

## 2013-11-03 DIAGNOSIS — R0602 Shortness of breath: Secondary | ICD-10-CM | POA: Diagnosis present

## 2013-11-03 DIAGNOSIS — Z993 Dependence on wheelchair: Secondary | ICD-10-CM | POA: Diagnosis not present

## 2013-11-03 DIAGNOSIS — B370 Candidal stomatitis: Secondary | ICD-10-CM

## 2013-11-03 DIAGNOSIS — I498 Other specified cardiac arrhythmias: Secondary | ICD-10-CM | POA: Diagnosis present

## 2013-11-03 DIAGNOSIS — D72829 Elevated white blood cell count, unspecified: Secondary | ICD-10-CM | POA: Diagnosis present

## 2013-11-03 DIAGNOSIS — I428 Other cardiomyopathies: Secondary | ICD-10-CM | POA: Diagnosis present

## 2013-11-03 DIAGNOSIS — F0390 Unspecified dementia without behavioral disturbance: Secondary | ICD-10-CM | POA: Diagnosis present

## 2013-11-03 DIAGNOSIS — I2510 Atherosclerotic heart disease of native coronary artery without angina pectoris: Secondary | ICD-10-CM | POA: Diagnosis present

## 2013-11-03 DIAGNOSIS — R1310 Dysphagia, unspecified: Secondary | ICD-10-CM | POA: Diagnosis present

## 2013-11-03 DIAGNOSIS — Z85038 Personal history of other malignant neoplasm of large intestine: Secondary | ICD-10-CM | POA: Diagnosis not present

## 2013-11-03 DIAGNOSIS — E860 Dehydration: Secondary | ICD-10-CM

## 2013-11-03 DIAGNOSIS — Z7982 Long term (current) use of aspirin: Secondary | ICD-10-CM

## 2013-11-03 DIAGNOSIS — Z515 Encounter for palliative care: Secondary | ICD-10-CM

## 2013-11-03 DIAGNOSIS — I509 Heart failure, unspecified: Secondary | ICD-10-CM | POA: Diagnosis present

## 2013-11-03 DIAGNOSIS — E440 Moderate protein-calorie malnutrition: Secondary | ICD-10-CM | POA: Diagnosis present

## 2013-11-03 DIAGNOSIS — F0391 Unspecified dementia with behavioral disturbance: Secondary | ICD-10-CM

## 2013-11-03 DIAGNOSIS — Z8719 Personal history of other diseases of the digestive system: Secondary | ICD-10-CM

## 2013-11-03 DIAGNOSIS — R9430 Abnormal result of cardiovascular function study, unspecified: Secondary | ICD-10-CM

## 2013-11-03 DIAGNOSIS — I4589 Other specified conduction disorders: Secondary | ICD-10-CM

## 2013-11-03 DIAGNOSIS — F03918 Unspecified dementia, unspecified severity, with other behavioral disturbance: Secondary | ICD-10-CM

## 2013-11-03 LAB — CBC WITH DIFFERENTIAL/PLATELET
BASOS ABS: 0 10*3/uL (ref 0.0–0.1)
BASOS PCT: 0 % (ref 0–1)
Eosinophils Absolute: 0.5 10*3/uL (ref 0.0–0.7)
Eosinophils Relative: 4 % (ref 0–5)
HEMATOCRIT: 37.5 % — AB (ref 39.0–52.0)
Hemoglobin: 12.6 g/dL — ABNORMAL LOW (ref 13.0–17.0)
LYMPHS PCT: 12 % (ref 12–46)
Lymphs Abs: 1.5 10*3/uL (ref 0.7–4.0)
MCH: 30.7 pg (ref 26.0–34.0)
MCHC: 33.6 g/dL (ref 30.0–36.0)
MCV: 91.5 fL (ref 78.0–100.0)
MONO ABS: 1.2 10*3/uL — AB (ref 0.1–1.0)
Monocytes Relative: 10 % (ref 3–12)
NEUTROS ABS: 8.8 10*3/uL — AB (ref 1.7–7.7)
NEUTROS PCT: 74 % (ref 43–77)
Platelets: 325 10*3/uL (ref 150–400)
RBC: 4.1 MIL/uL — ABNORMAL LOW (ref 4.22–5.81)
RDW: 13.4 % (ref 11.5–15.5)
WBC: 11.9 10*3/uL — AB (ref 4.0–10.5)

## 2013-11-03 LAB — URINALYSIS, ROUTINE W REFLEX MICROSCOPIC
BILIRUBIN URINE: NEGATIVE
GLUCOSE, UA: NEGATIVE mg/dL
Hgb urine dipstick: NEGATIVE
KETONES UR: NEGATIVE mg/dL
Leukocytes, UA: NEGATIVE
Nitrite: NEGATIVE
Protein, ur: NEGATIVE mg/dL
Specific Gravity, Urine: 1.024 (ref 1.005–1.030)
Urobilinogen, UA: 0.2 mg/dL (ref 0.0–1.0)
pH: 5 (ref 5.0–8.0)

## 2013-11-03 LAB — BASIC METABOLIC PANEL
Anion gap: 11 (ref 5–15)
BUN: 24 mg/dL — ABNORMAL HIGH (ref 6–23)
CHLORIDE: 101 meq/L (ref 96–112)
CO2: 25 meq/L (ref 19–32)
CREATININE: 1.06 mg/dL (ref 0.50–1.35)
Calcium: 8.7 mg/dL (ref 8.4–10.5)
GFR calc non Af Amer: 61 mL/min — ABNORMAL LOW (ref >90)
GFR, EST AFRICAN AMERICAN: 70 mL/min — AB (ref >90)
Glucose, Bld: 134 mg/dL — ABNORMAL HIGH (ref 70–99)
POTASSIUM: 4.2 meq/L (ref 3.7–5.3)
SODIUM: 137 meq/L (ref 137–147)

## 2013-11-03 LAB — TROPONIN I

## 2013-11-03 LAB — STREP PNEUMONIAE URINARY ANTIGEN: STREP PNEUMO URINARY ANTIGEN: NEGATIVE

## 2013-11-03 LAB — MRSA PCR SCREENING: MRSA by PCR: NEGATIVE

## 2013-11-03 MED ORDER — SODIUM CHLORIDE 0.9 % IV SOLN
INTRAVENOUS | Status: DC
  Administered 2013-11-03 – 2013-11-05 (×5): via INTRAVENOUS

## 2013-11-03 MED ORDER — ENSURE PUDDING PO PUDG
1.0000 | Freq: Three times a day (TID) | ORAL | Status: DC
  Administered 2013-11-03 – 2013-11-05 (×5): 1 via ORAL
  Filled 2013-11-03 (×12): qty 1

## 2013-11-03 MED ORDER — CHLORHEXIDINE GLUCONATE 0.12 % MT SOLN
15.0000 mL | Freq: Two times a day (BID) | OROMUCOSAL | Status: DC
  Administered 2013-11-03 – 2013-11-06 (×5): 15 mL via OROMUCOSAL
  Filled 2013-11-03 (×10): qty 15

## 2013-11-03 MED ORDER — LISINOPRIL 5 MG PO TABS
5.0000 mg | ORAL_TABLET | Freq: Every morning | ORAL | Status: DC
  Administered 2013-11-04 – 2013-11-05 (×2): 5 mg via ORAL
  Filled 2013-11-03 (×3): qty 1

## 2013-11-03 MED ORDER — HEPARIN SODIUM (PORCINE) 5000 UNIT/ML IJ SOLN
5000.0000 [IU] | Freq: Three times a day (TID) | INTRAMUSCULAR | Status: DC
  Administered 2013-11-03 – 2013-11-06 (×10): 5000 [IU] via SUBCUTANEOUS
  Filled 2013-11-03 (×12): qty 1

## 2013-11-03 MED ORDER — GUAIFENESIN 100 MG/5ML PO SYRP
200.0000 mg | ORAL_SOLUTION | Freq: Four times a day (QID) | ORAL | Status: DC | PRN
  Filled 2013-11-03: qty 10

## 2013-11-03 MED ORDER — CITALOPRAM HYDROBROMIDE 10 MG PO TABS
10.0000 mg | ORAL_TABLET | ORAL | Status: DC
  Filled 2013-11-03: qty 1

## 2013-11-03 MED ORDER — DIGOXIN 125 MCG PO TABS
0.1250 mg | ORAL_TABLET | Freq: Every morning | ORAL | Status: DC
  Administered 2013-11-04 – 2013-11-05 (×2): 0.125 mg via ORAL
  Filled 2013-11-03 (×3): qty 1

## 2013-11-03 MED ORDER — ASPIRIN 81 MG PO CHEW
81.0000 mg | CHEWABLE_TABLET | Freq: Every day | ORAL | Status: DC
  Administered 2013-11-04 – 2013-11-05 (×2): 81 mg via ORAL
  Filled 2013-11-03 (×4): qty 1

## 2013-11-03 MED ORDER — QUETIAPINE 12.5 MG HALF TABLET
12.5000 mg | ORAL_TABLET | Freq: Every day | ORAL | Status: DC
  Administered 2013-11-03 – 2013-11-04 (×2): 12.5 mg via ORAL
  Filled 2013-11-03 (×4): qty 1

## 2013-11-03 MED ORDER — SODIUM CHLORIDE 0.9 % IV BOLUS (SEPSIS)
500.0000 mL | Freq: Once | INTRAVENOUS | Status: AC
  Administered 2013-11-03: 500 mL via INTRAVENOUS

## 2013-11-03 MED ORDER — PIPERACILLIN-TAZOBACTAM 3.375 G IVPB
3.3750 g | Freq: Three times a day (TID) | INTRAVENOUS | Status: DC
  Administered 2013-11-03 – 2013-11-06 (×10): 3.375 g via INTRAVENOUS
  Filled 2013-11-03 (×11): qty 50

## 2013-11-03 MED ORDER — CETYLPYRIDINIUM CHLORIDE 0.05 % MT LIQD
7.0000 mL | Freq: Two times a day (BID) | OROMUCOSAL | Status: DC
  Administered 2013-11-03 – 2013-11-05 (×5): 7 mL via OROMUCOSAL

## 2013-11-03 MED ORDER — GUAIFENESIN ER 600 MG PO TB12
1200.0000 mg | ORAL_TABLET | Freq: Two times a day (BID) | ORAL | Status: DC
  Administered 2013-11-03 – 2013-11-05 (×4): 1200 mg via ORAL
  Filled 2013-11-03 (×5): qty 2

## 2013-11-03 MED ORDER — CARVEDILOL 3.125 MG PO TABS
3.1250 mg | ORAL_TABLET | Freq: Two times a day (BID) | ORAL | Status: DC
  Administered 2013-11-03 – 2013-11-05 (×4): 3.125 mg via ORAL
  Filled 2013-11-03 (×6): qty 1

## 2013-11-03 MED ORDER — SODIUM CHLORIDE 0.9 % IV SOLN
INTRAVENOUS | Status: DC
  Administered 2013-11-03: 10:00:00 via INTRAVENOUS

## 2013-11-03 MED ORDER — RISPERIDONE 0.25 MG PO TABS
0.2500 mg | ORAL_TABLET | Freq: Two times a day (BID) | ORAL | Status: DC | PRN
  Administered 2013-11-04 – 2013-11-05 (×3): 0.5 mg via ORAL
  Filled 2013-11-03 (×4): qty 2

## 2013-11-03 MED ORDER — HYDROCORTISONE ACETATE 25 MG RE SUPP
25.0000 mg | Freq: Two times a day (BID) | RECTAL | Status: DC | PRN
  Filled 2013-11-03: qty 1

## 2013-11-03 MED ORDER — LEVOFLOXACIN IN D5W 500 MG/100ML IV SOLN
500.0000 mg | Freq: Once | INTRAVENOUS | Status: DC
  Administered 2013-11-03: 500 mg via INTRAVENOUS
  Filled 2013-11-03: qty 100

## 2013-11-03 NOTE — H&P (Signed)
[facility] History and Physical  Keith Conrad:[id number] DOB: [birth year] DOA: 11/03/2013  Referring physician: Dr. Eulis Foster PCP: No primary provider on file.   Chief Complaint: Cough  HPI: Keith Conrad is a 80 y.o. male with past medical history of advanced dementia, HTN and bradycardia. Patient brought in to the hospital because of shortness of breath. Patient lives in [facility] dementia unit, he was noticed to be lethargic and less active for the past 10 days, for the past 2 days he started to have some cough and shortness of breath. He was seen by the physician in the nursing home and Levaquin was prescribed yesterday according to his family. The patient brought to the emergency department because of SOB and cough. In the ED CXR showed right lower lobe atelectasis versus infiltrates. Patient has slightleukocytosis of 11.9. Patient will be admitted to the hospital for further evaluation.   Review of Systems:  Review of systems cannot be obtained secondary to advanced dementia, patient does not follow commands or questions.  Past Medical History  Diagnosis Date  . CAD (coronary artery disease)     Catheterization September, 2009, total RCA, left to right collaterals, 90% diagonal, EF 10-15%  /   EF improved clinically over time  . Cardiomyopathy     EF 10-15% September, 2009  /  improved, echo, November, 2009, EF 50%,  posterior hypokinesis  . Congestive heart failure     class II to III  . GERD (gastroesophageal reflux disease)   . History of ulcerative colitis   . Bradycardia   . Hyperlipidemia   . Hypertension   . Chronotropic incompetence     Assess by Dr. Caryl Comes 2009, treadmill September, 2009 maximal heart rate 88  /   treadmill December, 2009, maximum heart rate 103, no further evaluation  . Ejection fraction     EF 10-15%, September, 2009  / iimproved, EF 50%, echo, November, 2009  /   EF 55%, nuclear, January, 2012  . Malignancy     colonic  .  Dementia    Past Surgical History  Procedure Laterality Date  . Tonsillectomy    . Colectomy     Social History:   reports that he has never smoked. He has never used smokeless tobacco. He reports that he drinks alcohol. He reports that he does not use illicit drugs.  No Known Allergies  History reviewed. No pertinent family history.   Prior to Admission medications   Medication Sig Start Date End Date Taking? Authorizing Provider  acetaminophen (TYLENOL) 325 MG tablet Take 650 mg by mouth every 6 (six) hours as needed for pain.   Yes Historical Provider, MD  aspirin 81 MG chewable tablet Chew 81 mg by mouth daily with breakfast.   Yes Historical Provider, MD  B Complex-C (B-COMPLEX WITH VITAMIN C) tablet Take 1 tablet by mouth every morning.   Yes Historical Provider, MD  carvedilol (COREG) 3.125 MG tablet Take 3.125 mg by mouth 2 (two) times daily with a meal.   Yes Historical Provider, MD  citalopram (CELEXA) 10 MG tablet Take 10 mg by mouth every Monday, Wednesday, and Friday.    Yes Historical Provider, MD  digoxin (LANOXIN) 0.125 MG tablet Take 0.125 mg by mouth every morning.   Yes Historical Provider, MD  feeding supplement (ENSURE IMMUNE HEALTH) LIQD Take 237 mLs by mouth 2 (two) times daily between meals.   Yes Historical Provider, MD  furosemide (LASIX) 20 MG tablet Take 20 mg by  mouth daily.   Yes Historical Provider, MD  guaiFENesin (MUCINEX) 600 MG 12 hr tablet Take 600 mg by mouth every 12 (twelve) hours. For 7 days 11/01/13  Yes Historical Provider, MD  guaifenesin (ROBITUSSIN) 100 MG/5ML syrup Take 200 mg by mouth every 6 (six) hours as needed for cough.   Yes Historical Provider, MD  hydrocortisone (ANUCORT-HC) 25 MG suppository Place 25 mg rectally 2 (two) times daily as needed for hemorrhoids or itching.   Yes Historical Provider, MD  levofloxacin (LEVAQUIN) 500 MG tablet Take 500 mg by mouth at bedtime. For 7 days 11/01/13  Yes Historical Provider, MD  lisinopril  (PRINIVIL,ZESTRIL) 5 MG tablet Take 5 mg by mouth every morning.   Yes Historical Provider, MD  loperamide (IMODIUM) 2 MG capsule Take 2 mg by mouth as needed for diarrhea or loose stools.   Yes Historical Provider, MD  psyllium (REGULOID) 0.52 G capsule Take 0.52 g by mouth at bedtime.   Yes Historical Provider, MD  QUEtiapine (SEROQUEL) 25 MG tablet Take 12.5 mg by mouth at bedtime.    Yes Historical Provider, MD  risperiDONE (RISPERDAL) 0.5 MG tablet Take 0.25-0.5 mg by mouth 2 (two) times daily as needed (for agitation). Takes 0.25mg  every morning can also take every 6 hours if needed   Yes Historical Provider, MD   Physical Exam: Filed Vitals:   11/03/13 1209  BP:   Pulse: 80  Temp:   Resp: 19   Constitutional: Elderly fragile Caucasian male, does not appear to be in distress Head: Normocephalic and atraumatic.  Nose: Nose normal.  Mouth/Throat: Uvula is midline, oropharynx is clear and moist and mucous membranes are normal.  Eyes: Conjunctivae and EOM are normal. Pupils are equal, round, and reactive to light.  Neck: Trachea normal and normal range of motion. Neck supple.  Cardiovascular:   bradycardic,thm, S1 normal, S2 normal, normal heart sounds and intact distal pulses.   Pulmonary/Chest: Effort normal and breath sounds normal. but has congested cough  Abdominal: Soft. Bowel sounds are normal. There is no hepatosplenomegaly. There is no tenderness.  Musculoskeletal: Normal range of motion.  Neurological: Does not follow commands, not much of neurological examination done.  Skin: Skin is warm, dry and intact.  Psychiatric: Has a normal mood and affect. Speech is normal and behavior is normal.   Labs on Admission:  Basic Metabolic Panel:  Recent Labs Lab 11/03/13 0843  NA 137  K 4.2  CL 101  CO2 25  GLUCOSE 134*  BUN 24*  CREATININE 1.06  CALCIUM 8.7   Liver Function Tests: No results found for this basename: AST, ALT, ALKPHOS, BILITOT, PROT, ALBUMIN,  in the last  168 hours No results found for this basename: LIPASE, AMYLASE,  in the last 168 hours No results found for this basename: AMMONIA,  in the last 168 hours CBC:  Recent Labs Lab 11/03/13 0843  WBC 11.9*  NEUTROABS 8.8*  HGB 12.6*  HCT 37.5*  MCV 91.5  PLT 325   Cardiac Enzymes:  Recent Labs Lab 11/03/13 0843  TROPONINI <0.30    BNP (last 3 results) No results found for this basename: PROBNP,  in the last 8760 hours CBG: No results found for this basename: GLUCAP,  in the last 168 hours  Radiological Exams on Admission: Dg Chest 2 View  11/03/2013   CLINICAL DATA:  Shortness of breath and cough  EXAM: CHEST  2 VIEW  COMPARISON:  05/31/2013  FINDINGS: Cardiac shadow is within normal limits. The lungs  are well aerated. Mild right basilar atelectasis is seen. A chronic compression deformity is again noted in the thoracic spine.  IMPRESSION: New right basilar atelectasis.   Electronically Signed   By: Inez Catalina M.D.   On: 11/03/2013 09:11    EKG: Independently reviewed.   Assessment/Plan Principal Problem:   CAP (community acquired pneumonia) Active Problems:   CAD (coronary artery disease)   Hypertension   Dementia   Dysphagia    Pneumonia -Health care associated pneumonia versus aspiration pneumonia. -Patient started on Levaquin yesterday, no much time for clinical response to present to the hospital. -Started on Zosyn. -Supportive management with broncho- dilators, mucolytics and antitussives, oxygen as needed.  Dementia -Patient has advanced dementia, does not have any dementia medicines. -Patient lives in a dementia unit, he might needs SNF on discharge.  Dysphagia -Patient is on pured diet with nectar thick liquids, family mentioned that he still coughs when he needs. -SLP to evaluate. -Prolonged discussion with the family about poor prognosis when advanced dementia is complicated by dysphagia and pneumonia.   Bradycardia -Patient has chronic  bradycardia which been asymptomatic. No further workup. -Patient is on Coreg he continues, if very significant bradycardia, I might discontinue Coreg.  Code Status:  full code  Family Communication:  plan discussed with the son at bedside.  Disposition Plan:  inpatient   Time spent: 70 minutes  [facility] Pager [phone]

## 2013-11-03 NOTE — Progress Notes (Signed)
ANTIBIOTIC CONSULT NOTE - INITIAL  Pharmacy Consult for Zosyn Indication: HCAP vs Aspiration PNA  No Known Allergies  Patient Measurements:   Adjusted Body Weight:   Vital Signs: Temp: 97.5 F (36.4 C) (09/18 1250) Temp src: Axillary (09/18 1250) BP: 116/53 mmHg (09/18 1250) Pulse Rate: 62 (09/18 1250) Intake/Output from previous day:   Intake/Output from this shift: Total I/O In: -  Out: 200 [Urine:200]  Labs:  Recent Labs  11/03/13 0843  WBC 11.9*  HGB 12.6*  PLT 325  CREATININE 1.06   The CrCl is unknown because both a height and weight (above a minimum accepted value) are required for this calculation. No results found for this basename: VANCOTROUGH, VANCOPEAK, VANCORANDOM, GENTTROUGH, GENTPEAK, GENTRANDOM, TOBRATROUGH, TOBRAPEAK, TOBRARND, AMIKACINPEAK, AMIKACINTROU, AMIKACIN,  in the last 72 hours   Microbiology: No results found for this or any previous visit (from the past 720 hour(s)).  Medical History: Past Medical History  Diagnosis Date  . CAD (coronary artery disease)     Catheterization September, 2009, total RCA, left to right collaterals, 90% diagonal, EF 10-15%  /   EF improved clinically over time  . Cardiomyopathy     EF 10-15% September, 2009  /  improved, echo, November, 2009, EF 50%,  posterior hypokinesis  . Congestive heart failure     class II to III  . GERD (gastroesophageal reflux disease)   . History of ulcerative colitis   . Bradycardia   . Hyperlipidemia   . Hypertension   . Chronotropic incompetence     Assess by Dr. Caryl Comes 2009, treadmill September, 2009 maximal heart rate 88  /   treadmill December, 2009, maximum heart rate 103, no further evaluation  . Ejection fraction     EF 10-15%, September, 2009  / iimproved, EF 50%, echo, November, 2009  /   EF 55%, nuclear, January, 2012  . Malignancy     colonic  . Dementia    Assessment: 31 yoM from [facility] dementia unit with CC of cough/SOB and lethargy.  PMHx pertinent  for advanced dementia, HTN, and bradycardia.  Pt was started on levaquin 9/17.  CXR in ED shows RLL atelectasis vs infiltrates.  Pharmacy consulted to start zosyn for HCAP vs aspiration PNA.  NKDA.   9/17 >> Levaquin >> 9/18 9/18 >> Zosyn  >>    Tmax: Afebrile WBCs: Slightly elevated Renal: SCr 1.06, WNL, Normalized CrCl = 48 (waiting for updated wt)  9/18 blood: ordered 9/18 urine: collected  9/18: strep/legionella urinary antigen ordered  Goal of Therapy:  Eradication of infection  Plan:  Start Zosyn 3.375g IV q8h (infuse over 4 hours) Follow-up renal fxn, microbiology, clinical course   Ralene Bathe, PharmD, BCPS 11/03/2013, 1:05 PM  Pager: [phone]

## 2013-11-03 NOTE — ED Notes (Signed)
Patient transported to [facility] 

## 2013-11-03 NOTE — ED Notes (Signed)
Bed: WA24 Expected date:  Expected time:  Means of arrival:  Comments: EMS-SOB 

## 2013-11-03 NOTE — Progress Notes (Signed)
INITIAL NUTRITION ASSESSMENT  DOCUMENTATION CODES Per approved criteria  -Non-severe (moderate) malnutrition in the context of chronic illness  Pt meets criteria for moderate MALNUTRITION in the context of chronic illness as evidenced by moderate and severe muscle wasting and subcutaneous fat loss.   INTERVENTION: -Recommend Ensure Pudding po TID, each supplement provides 170 kcal and 4 grams of protein -Diet textures per SLP -RD to continue to monitor  NUTRITION DIAGNOSIS: Inadequate oral intake related to lethargy/dementia as evidenced by 10 day hx of lethargy.   Goal: Pt to meet >/= 90% of their estimated nutrition needs    Monitor:  Total protein/energy intake, labs, weights, swallow profile  Reason for Assessment: MST  80 y.o. male  Admitting Dx: CAP (community acquired pneumonia)  ASSESSMENT: Keith Conrad is a 80 y.o. male with past medical history of advanced dementia, HTN and bradycardia. Patient brought in to the hospital because of shortness of breath. Patient lives in [facility] dementia unit, he was noticed to be lethargic and less active for the past 10 days, for the past 2 days he started to have some cough and shortness of breath  -Pt with advanced dementia, unable to provide food/nutrition hx. No family present to provide additional information on several attempts -Per H&P, pt with 10 day period of increased fatigue and lethargy, likely indicating decreased PO intake 2/2 decreased alertness -Was on Dysphagia 1/nectar thick diet w/Ensure Immune Health BID at SNF per medical records. Continues to cough when he eats per family report. SLP evaluation pending -Will modify Ensure to Ensure in pudding form to assist with swallowing/diet texture compliance -Pt had not yet received meal during time of RD assessment -Has lost 20 lbs since 2012; however unable to determine if gradual weight loss over past 3 years or if a more drastic recent loss has occurred -Some  muscle wasting/fat loss likely d/t to aging process Nutrition Focused Physical Exam:  Subcutaneous Fat:  Orbital Region: moderate Upper Arm Region: moderate Thoracic and Lumbar Region: moderate  Muscle:  Temple Region: severe Clavicle Bone Region: severe Clavicle and Acromion Bone Region: moderate Scapular Bone Region: n/a Dorsal Hand: moderate Patellar Region: severe Anterior Thigh Region: severe Posterior Calf Region: moderate  Edema: none noted    Height: Ht Readings from Last 1 Encounters:  02/25/10 5\' 5"  (1.651 m)    Weight: Wt Readings from Last 1 Encounters:  11/03/13 122 lb 2.2 oz (55.4 kg)    Ideal Body Weight: 136 lb  % Ideal Body Weight: 90%  Wt Readings from Last 10 Encounters:  11/03/13 122 lb 2.2 oz (55.4 kg)  02/04/11 143 lb (64.864 kg)  02/25/10 143 lb (64.864 kg)  01/15/10 141 lb 12 oz (64.297 kg)  11/21/08 142 lb 8 oz (64.638 kg)  05/02/08 145 lb (65.772 kg)    Usual Body Weight: unable to determine  % Usual Body Weight: unable to determine  BMI:  Body mass index is 20.32 kg/(m^2).  Estimated Nutritional Needs: Kcal: 1650-1850 Protein: 65-80 gram Fluid: >/=1700 ml daily  Skin: WDL  Diet Order: Dysphagia1/nectar thick  EDUCATION NEEDS: -No education needs identified at this time   Intake/Output Summary (Last 24 hours) at 11/03/13 1416 Last data filed at 11/03/13 0953  Gross per 24 hour  Intake      0 ml  Output    200 ml  Net   -200 ml    Last BM: PTA   Labs:   Recent Labs Lab 11/03/13 0843  NA 137  K 4.2  CL 101  CO2 25  BUN 24*  CREATININE 1.06  CALCIUM 8.7  GLUCOSE 134*    CBG (last 3)  No results found for this basename: GLUCAP,  in the last 72 hours  Scheduled Meds: . [START ON 11/04/2013] aspirin  81 mg Oral Q breakfast  . carvedilol  3.125 mg Oral BID WC  . [START ON 11/06/2013] citalopram  10 mg Oral Q M,W,F  . [START ON 11/04/2013] digoxin  0.125 mg Oral q morning - 10a  . guaiFENesin  1,200 mg  Oral Q12H  . heparin  5,000 Units Subcutaneous 3 times per day  . [START ON 11/04/2013] lisinopril  5 mg Oral q morning - 10a  . piperacillin-tazobactam (ZOSYN)  IV  3.375 g Intravenous 3 times per day  . QUEtiapine  12.5 mg Oral QHS    Continuous Infusions: . sodium chloride 75 mL/hr at 11/03/13 1359    Past Medical History  Diagnosis Date  . CAD (coronary artery disease)     Catheterization September, 2009, total RCA, left to right collaterals, 90% diagonal, EF 10-15%  /   EF improved clinically over time  . Cardiomyopathy     EF 10-15% September, 2009  /  improved, echo, November, 2009, EF 50%,  posterior hypokinesis  . Congestive heart failure     class II to III  . GERD (gastroesophageal reflux disease)   . History of ulcerative colitis   . Bradycardia   . Hyperlipidemia   . Hypertension   . Chronotropic incompetence     Assess by Dr. Caryl Comes 2009, treadmill September, 2009 maximal heart rate 88  /   treadmill December, 2009, maximum heart rate 103, no further evaluation  . Ejection fraction     EF 10-15%, September, 2009  / iimproved, EF 50%, echo, November, 2009  /   EF 55%, nuclear, January, 2012  . Malignancy     colonic  . Dementia     Past Surgical History  Procedure Laterality Date  . Tonsillectomy    . [place] Tuttle Clinical Dietitian [phone]

## 2013-11-03 NOTE — ED Notes (Signed)
MD at bedside. Unable to administer Abx at this time.

## 2013-11-03 NOTE — ED Provider Notes (Signed)
CSN: [id number]     Arrival date & time 11/03/13  0768 History   First MD Initiated Contact with Patient 11/03/13 [phone]     Chief Complaint  Patient presents with  . Shortness of Breath     (Consider location/radiation/quality/duration/timing/severity/associated sxs/prior Treatment) Patient is a 80 y.o. male presenting with shortness of breath. The history is provided by the nursing home and a relative.  Shortness of Breath  He is here with his daughter, who reports that he is here for cough, pneumonia, altered mental status and increasing weakness. His symptoms have been gradual in onset over the last week. Yesterday. He was started on Levaquin for pneumonia based on x-Izaia, done 2 days ago. He's had decreased appetite for about a week. He usually walks easily, but now cannot walk. He was evaluated about one week ago for a fall with injuries to the right face, and right arm. Since that time he has been seen by physical therapy. Apparently, he may have had weakness that preceded, and possibly caused the fall, according to his family member. No documented fever or vomiting, or falls or other noted problems. The daughter sees him about once a week.  Level 5 caveat: Dementia  Past Medical History  Diagnosis Date  . CAD (coronary artery disease)     Catheterization September, 2009, total RCA, left to right collaterals, 90% diagonal, EF 10-15%  /   EF improved clinically over time  . Cardiomyopathy     EF 10-15% September, 2009  /  improved, echo, November, 2009, EF 50%,  posterior hypokinesis  . Congestive heart failure     class II to III  . GERD (gastroesophageal reflux disease)   . History of ulcerative colitis   . Bradycardia   . Hyperlipidemia   . Hypertension   . Chronotropic incompetence     Assess by Dr. Caryl Comes 2009, treadmill September, 2009 maximal heart rate 88  /   treadmill December, 2009, maximum heart rate 103, no further evaluation  . Ejection fraction     EF 10-15%,  September, 2009  / iimproved, EF 50%, echo, November, 2009  /   EF 55%, nuclear, January, 2012  . Malignancy     colonic  . Dementia    Past Surgical History  Procedure Laterality Date  . Tonsillectomy    . Colectomy     History reviewed. No pertinent family history. History  Substance Use Topics  . Smoking status: Never Smoker   . Smokeless tobacco: Not on file  . Alcohol Use: Yes     Comment: occasional    Review of Systems  Respiratory: Positive for shortness of breath.       Allergies  Review of patient's allergies indicates no known allergies.  Home Medications   Prior to Admission medications   Medication Sig Start Date End Date Taking? Authorizing Provider  acetaminophen (TYLENOL) 325 MG tablet Take 650 mg by mouth every 6 (six) hours as needed for pain.   Yes Historical Provider, MD  aspirin 81 MG chewable tablet Chew 81 mg by mouth daily with breakfast.   Yes Historical Provider, MD  B Complex-C (B-COMPLEX WITH VITAMIN C) tablet Take 1 tablet by mouth every morning.   Yes Historical Provider, MD  carvedilol (COREG) 3.125 MG tablet Take 3.125 mg by mouth 2 (two) times daily with a meal.   Yes Historical Provider, MD  citalopram (CELEXA) 10 MG tablet Take 10 mg by mouth every Monday, Wednesday, and Friday.  Yes Historical Provider, MD  digoxin (LANOXIN) 0.125 MG tablet Take 0.125 mg by mouth every morning.   Yes Historical Provider, MD  feeding supplement (ENSURE IMMUNE HEALTH) LIQD Take 237 mLs by mouth 2 (two) times daily between meals.   Yes Historical Provider, MD  furosemide (LASIX) 20 MG tablet Take 20 mg by mouth daily.   Yes Historical Provider, MD  guaiFENesin (MUCINEX) 600 MG 12 hr tablet Take 600 mg by mouth every 12 (twelve) hours. For 7 days 11/01/13  Yes Historical Provider, MD  guaifenesin (ROBITUSSIN) 100 MG/5ML syrup Take 200 mg by mouth every 6 (six) hours as needed for cough.   Yes Historical Provider, MD  hydrocortisone (ANUCORT-HC) 25 MG  suppository Place 25 mg rectally 2 (two) times daily as needed for hemorrhoids or itching.   Yes Historical Provider, MD  levofloxacin (LEVAQUIN) 500 MG tablet Take 500 mg by mouth at bedtime. For 7 days 11/01/13  Yes Historical Provider, MD  lisinopril (PRINIVIL,ZESTRIL) 5 MG tablet Take 5 mg by mouth every morning.   Yes Historical Provider, MD  loperamide (IMODIUM) 2 MG capsule Take 2 mg by mouth as needed for diarrhea or loose stools.   Yes Historical Provider, MD  psyllium (REGULOID) 0.52 G capsule Take 0.52 g by mouth at bedtime.   Yes Historical Provider, MD  QUEtiapine (SEROQUEL) 25 MG tablet Take 12.5 mg by mouth at bedtime.    Yes Historical Provider, MD  risperiDONE (RISPERDAL) 0.5 MG tablet Take 0.25-0.5 mg by mouth 2 (two) times daily as needed (for agitation). Takes 0.25mg  every morning can also take every 6 hours if needed   Yes Historical Provider, MD   BP 89/59  Pulse 51  Temp(Src) 98.5 F (36.9 C) (Oral)  Resp 16  SpO2 100% Physical Exam  Nursing note and vitals reviewed. Constitutional: He appears well-developed.  Elderly frail  HENT:  Head: Normocephalic and atraumatic.  Right Ear: External ear normal.  Left Ear: External ear normal.  Mucus in mouth, scattered. Anterior tongue has, 2 small areas of white plaque that do not remove, when scraped. There does not appear to be any other oral lesions that would be consistent with thrush. Thus the appearance of the plaques is nonspecific  Eyes: Conjunctivae and EOM are normal. Pupils are equal, round, and reactive to light.  Neck: Normal range of motion and phonation normal. Neck supple.  Cardiovascular: Normal rate, regular rhythm and normal heart sounds.   Pulmonary/Chest: Effort normal and breath sounds normal. He exhibits no bony tenderness.  Abdominal: Soft. There is no tenderness.  Musculoskeletal: Normal range of motion.  Neurological: He is alert. No cranial nerve deficit or sensory deficit.  Increased muscle tone,  with cogwheeling rigidity, with passive motion of knees, bilaterally. No focal asymmetry of strength.  Skin: Skin is warm, dry and intact.  Psychiatric: He has a normal mood and affect. His behavior is normal.    ED Course  Procedures (including critical care time)  Medications  sodium chloride 0.9 % bolus 500 mL (not administered)  0.9 %  sodium chloride infusion (not administered)    Patient Vitals for the past 24 hrs:  BP Temp Temp src Pulse Resp SpO2  11/03/13 0821 - 98.5 F (36.9 C) Oral - 16 -  11/03/13 0820 89/59 mmHg - - 51 - 100 %  11/03/13 0817 - - - - - 100 %  11/03/13 0813 - - - - - 96 %    11:01 AM Reevaluation with update and discussion.  After initial assessment and treatment, an updated evaluation reveals patient continues to cough. He turns red in the face when he coughs. I witnessed such an episode at this time. It lasted for about 20 seconds. He did not appear to gag or aspirate. He has not been able to drink any fluids since her. Findings were discussed with patient is family members were in the room. He feels like he is somewhat more alert after having IV fluids. It is in a facility , which can I get ongoing treatment with IV fluids. He is apparently not taking his dose of Levaquin, yet this morning. I will order an IV dose, and arrange for admission. Viktor Philipp L    11:00 AM-Consult complete with hospitalist. Patient case explained and discussed. They agrees to admit patient for further evaluation and treatment.   Labs Review Labs Reviewed  CBC WITH DIFFERENTIAL - Abnormal; Notable for the following:    WBC 11.9 (*)    RBC 4.10 (*)    Hemoglobin 12.6 (*)    HCT 37.5 (*)    Neutro Abs 8.8 (*)    Monocytes Absolute 1.2 (*)    All other components within normal limits  URINE CULTURE  BASIC METABOLIC PANEL  TROPONIN I  URINALYSIS, ROUTINE W REFLEX MICROSCOPIC    Imaging Review Dg Chest 2 View  11/03/2013   CLINICAL DATA:  Shortness of breath and cough   EXAM: CHEST  2 VIEW  COMPARISON:  05/31/2013  FINDINGS: Cardiac shadow is within normal limits. The lungs are well aerated. Mild right basilar atelectasis is seen. A chronic compression deformity is again noted in the thoracic spine.  IMPRESSION: New right basilar atelectasis.   Electronically Signed   By: Inez Catalina M.D.   On: 11/03/2013 09:11     EKG Interpretation   Date/Time:  Friday November 03 2013 08:28:37 EDT Ventricular Rate:  80 PR Interval:    QRS Duration: 106 QT Interval:  361 QTC Calculation: 416 R Axis:   -51 Text Interpretation:  Atrial fibrillation LAD, consider left anterior  fascicular block Anteroseptal infarct, old Repol abnrm suggests ischemia,  anterolateral Since last tracing minimal shift in axis, now with Left  anterior fasicular block Confirmed by Eulis Foster  MD, Reily Ilic ([id number]) on  11/03/2013 8:33:19 AM      MDM   Final diagnoses:  Community acquired pneumonia  Dehydration  Thrush    Nonspecific weakness. Mildly abnormal EKG as compared to prior more left shift, indicating left anterior fascicular block. There is no reported syncope to raise concern for cardiac arrhythmia. Chest x-Reuel, consistent with atelectasis, but no frank pneumonia. However, with a mild element of dehydration, BUN is elevated, the extent of pneumonia can be under revealed.  Patient has decreased oral intake and may have thrush. His coughing causes discomfort and he is not able to fully cough things out. He does not have a known prior swallowing disorder. Will consult hospitalist for admission to manage symptoms, in hospital.  Nursing Notes Reviewed/ Care Coordinated, and agree without changes. Applicable Imaging Reviewed.  Interpretation of Laboratory Data incorporated into ED treatment  Plan: Admit  Richarda Blade, MD 11/06/13 1106

## 2013-11-03 NOTE — ED Notes (Addendum)
Per EMS pt from [facility] sent to ED for SOB, no acute distress. Per EMS pt is alert at baseline, baseline is confusion, dementia, unable to communicate.

## 2013-11-04 DIAGNOSIS — R0989 Other specified symptoms and signs involving the circulatory and respiratory systems: Secondary | ICD-10-CM

## 2013-11-04 LAB — CBC
HCT: 34.6 % — ABNORMAL LOW (ref 39.0–52.0)
Hemoglobin: 11.4 g/dL — ABNORMAL LOW (ref 13.0–17.0)
MCH: 31 pg (ref 26.0–34.0)
MCHC: 32.9 g/dL (ref 30.0–36.0)
MCV: 94 fL (ref 78.0–100.0)
Platelets: 298 10*3/uL (ref 150–400)
RBC: 3.68 MIL/uL — AB (ref 4.22–5.81)
RDW: 13.5 % (ref 11.5–15.5)
WBC: 9.6 10*3/uL (ref 4.0–10.5)

## 2013-11-04 LAB — BASIC METABOLIC PANEL
Anion gap: 10 (ref 5–15)
BUN: 21 mg/dL (ref 6–23)
CO2: 26 meq/L (ref 19–32)
Calcium: 8.1 mg/dL — ABNORMAL LOW (ref 8.4–10.5)
Chloride: 106 mEq/L (ref 96–112)
Creatinine, Ser: 1.13 mg/dL (ref 0.50–1.35)
GFR calc Af Amer: 65 mL/min — ABNORMAL LOW (ref >90)
GFR calc non Af Amer: 56 mL/min — ABNORMAL LOW (ref >90)
GLUCOSE: 120 mg/dL — AB (ref 70–99)
POTASSIUM: 3.7 meq/L (ref 3.7–5.3)
SODIUM: 142 meq/L (ref 137–147)

## 2013-11-04 LAB — LEGIONELLA ANTIGEN, URINE: LEGIONELLA ANTIGEN, URINE: NEGATIVE

## 2013-11-04 LAB — URINE CULTURE
COLONY COUNT: NO GROWTH
CULTURE: NO GROWTH

## 2013-11-04 LAB — HIV ANTIBODY (ROUTINE TESTING W REFLEX): HIV: NONREACTIVE

## 2013-11-04 MED ORDER — RISPERIDONE 0.25 MG PO TABS
0.2500 mg | ORAL_TABLET | Freq: Every day | ORAL | Status: DC
  Administered 2013-11-05: 0.25 mg via ORAL
  Filled 2013-11-04 (×2): qty 1

## 2013-11-04 NOTE — Progress Notes (Signed)
[facility] PROGRESS NOTE   Keith Conrad ZOX:[id number] DOB: [birth year] DOA: 11/03/2013 PCP: No primary provider on file.  HPI/Subjective: Seen with son at bedside. Patient had weak cough and appears to be unable to clear his airways.  Assessment/Plan: Principal Problem:   CAP (community acquired pneumonia) Active Problems:   CAD (coronary artery disease)   Hypertension   Dementia   Dysphagia   Malnutrition of moderate degree    Pneumonia  -Health care associated pneumonia versus aspiration pneumonia.  -Patient started on Levaquin yesterday, no much time for clinical response. -Started on Zosyn.  -Supportive management with broncho- dilators, mucolytics and antitussives, oxygen as needed.   Dementia  -Patient has advanced dementia, not on any dementia medicines.  -Patient lives in a dementia unit, he might needs SNF on discharge.   Dysphagia  -Patient is on pured diet with nectar thick liquids, family mentioned that he still coughs when he eats. -SLP to evaluate.  -Prolonged discussion with the family about poor prognosis when advanced dementia is complicated by dysphagia and pneumonia.   Bradycardia  -Patient has chronic bradycardia which been asymptomatic. No further workup.  -Patient is on Coreg he continues, if very significant bradycardia, I might discontinue Coreg.  Advanced directives -Discussed with his POA his son [name], patient will be DO NOT RESUSCITATE. -Ask palliative medicine team consultation for goals of care  Code Status: DNR Family Communication: Plan discussed with the patient. Disposition Plan: Remains inpatient   Consultants:  PMT  Procedures:  None  Antibiotics:  Zosyn   Objective: Filed Vitals:   11/04/13 0415  BP: 150/83  Pulse: 67  Temp: 97.6 F (36.4 C)  Resp: 20    Intake/Output Summary (Last 24 hours) at 11/04/13 1217 Last data filed at 11/04/13 0423  Gross per 24 hour  Intake 833.75 ml  Output    350  ml  Net 483.75 ml   Filed Weights   11/03/13 1313  Weight: 55.4 kg (122 lb 2.2 oz)    Exam: General: Alert and awake, oriented x3, not in any acute distress. HEENT: anicteric sclera, pupils reactive to light and accommodation, EOMI CVS: S1-S2 clear, no murmur rubs or gallops Chest: clear to auscultation bilaterally, no wheezing, rales or rhonchi Abdomen: soft nontender, nondistended, normal bowel sounds, no organomegaly Extremities: no cyanosis, clubbing or edema noted bilaterally Neuro: Cranial nerves II-XII intact, no focal neurological deficits  Data Reviewed: Basic Metabolic Panel:  Recent Labs Lab 11/03/13 0843 11/04/13 0440  NA 137 142  K 4.2 3.7  CL 101 106  CO2 25 26  GLUCOSE 134* 120*  BUN 24* 21  CREATININE 1.06 1.13  CALCIUM 8.7 8.1*   Liver Function Tests: No results found for this basename: AST, ALT, ALKPHOS, BILITOT, PROT, ALBUMIN,  in the last 168 hours No results found for this basename: LIPASE, AMYLASE,  in the last 168 hours No results found for this basename: AMMONIA,  in the last 168 hours CBC:  Recent Labs Lab 11/03/13 0843 11/04/13 0440  WBC 11.9* 9.6  NEUTROABS 8.8*  --   HGB 12.6* 11.4*  HCT 37.5* 34.6*  MCV 91.5 94.0  PLT 325 298   Cardiac Enzymes:  Recent Labs Lab 11/03/13 0843  TROPONINI <0.30   BNP (last 3 results) No results found for this basename: PROBNP,  in the last 8760 hours CBG: No results found for this basename: GLUCAP,  in the last 168 hours  Micro Recent Results (from the past 240 hour(s))  MRSA PCR SCREENING  Status: None   Collection Time    11/03/13  1:03 PM      Result Value Ref Range Status   MRSA by PCR NEGATIVE  NEGATIVE Final   Comment:            The GeneXpert MRSA Assay (FDA     approved for NASAL specimens     only), is one component of a     comprehensive MRSA colonization     surveillance program. It is not     intended to diagnose MRSA     infection nor to guide or     monitor  treatment for     MRSA infections.  CULTURE, BLOOD (ROUTINE X 2)     Status: None   Collection Time    11/03/13  1:24 PM      Result Value Ref Range Status   Specimen Description BLOOD RIGHT ARM   Final   Special Requests BOTTLES DRAWN AEROBIC AND ANAEROBIC 10CC   Final   Culture  Setup Time     Final   Value: 11/03/2013 21:44     Performed at [facility]   Culture     Final   Value:        BLOOD CULTURE RECEIVED NO GROWTH TO DATE CULTURE WILL BE HELD FOR 5 DAYS BEFORE ISSUING A FINAL NEGATIVE REPORT     Performed at [facility]   Report Status PENDING   Incomplete  CULTURE, BLOOD (ROUTINE X 2)     Status: None   Collection Time    11/03/13  1:36 PM      Result Value Ref Range Status   Specimen Description BLOOD RIGHT ARM   Final   Special Requests BOTTLES DRAWN AEROBIC AND ANAEROBIC [facility]   Final   Culture  Setup Time     Final   Value: 11/03/2013 21:42     Performed at [facility]   Culture     Final   Value:        BLOOD CULTURE RECEIVED NO GROWTH TO DATE CULTURE WILL BE HELD FOR 5 DAYS BEFORE ISSUING A FINAL NEGATIVE REPORT     Performed at [facility]   Report Status PENDING   Incomplete     Studies: Dg Chest 2 View  11/03/2013   CLINICAL DATA:  Shortness of breath and cough  EXAM: CHEST  2 VIEW  COMPARISON:  05/31/2013  FINDINGS: Cardiac shadow is within normal limits. The lungs are well aerated. Mild right basilar atelectasis is seen. A chronic compression deformity is again noted in the thoracic spine.  IMPRESSION: New right basilar atelectasis.   Electronically Signed   By: Inez Catalina M.D.   On: 11/03/2013 09:11    Scheduled Meds: . antiseptic oral rinse  7 mL Mouth Rinse q12n4p  . aspirin  81 mg Oral Q breakfast  . carvedilol  3.125 mg Oral BID WC  . chlorhexidine  15 mL Mouth Rinse BID  . [START ON 11/06/2013] citalopram  10 mg Oral Q M,W,F  . digoxin  0.125 mg Oral q morning - 10a  . feeding supplement (ENSURE)  1 Container Oral  TID BM  . guaiFENesin  1,200 mg Oral Q12H  . heparin  5,000 Units Subcutaneous 3 times per day  . lisinopril  5 mg Oral q morning - 10a  . piperacillin-tazobactam (ZOSYN)  IV  3.375 g Intravenous 3 times per day  . QUEtiapine  12.5 mg Oral QHS  Continuous Infusions: . sodium chloride 75 mL/hr at 11/04/13 0108       Time spent: 35 minutes    [facility]  [facility] Pager [phone] If 7PM-7AM, please contact night-coverage at [email or web address], password [facility] 11/04/2013, 12:17 PM  LOS: 1 day

## 2013-11-04 NOTE — Evaluation (Signed)
Clinical/Bedside Swallow Evaluation Patient Details  Name: Keith Conrad MRN: [id number] Date of Birth: Aug 25, 1923  Today's Date: 11/04/2013 Time: 1544-1600 SLP Time Calculation (min): 16 min  Past Medical History:  Past Medical History  Diagnosis Date  . CAD (coronary artery disease)     Catheterization September, 2009, total RCA, left to right collaterals, 90% diagonal, EF 10-15%  /   EF improved clinically over time  . Cardiomyopathy     EF 10-15% September, 2009  /  improved, echo, November, 2009, EF 50%,  posterior hypokinesis  . Congestive heart failure     class II to III  . GERD (gastroesophageal reflux disease)   . History of ulcerative colitis   . Bradycardia   . Hyperlipidemia   . Hypertension   . Chronotropic incompetence     Assess by Dr. Caryl Comes 2009, treadmill September, 2009 maximal heart rate 88  /   treadmill December, 2009, maximum heart rate 103, no further evaluation  . Ejection fraction     EF 10-15%, September, 2009  / iimproved, EF 50%, echo, November, 2009  /   EF 55%, nuclear, January, 2012  . Malignancy     colonic  . Dementia    Past Surgical History:  Past Surgical History  Procedure Laterality Date  . Tonsillectomy    . Colectomy     HPI:  Keith Conrad is an 80 y.o. male with PMH of advanced dementia, dysphagia, HTN and bradycardia. Patient brought in to the hospital from dementia unit at [facility] because of shortness of breath. In the ED CXR showed right lower lobe atelectasis versus infiltrates. He has been consuming Dys 1 textures and nectar thick liquids.   Assessment / Plan / Recommendation Clinical Impression  Pt presents with what appears to be a primarily cognitively-based dysphagia marked by decreased bolus awareness, which results in difficulty managing thin liquids and Dys 2 textures within his oral cavity. This leads to suspected premature spillage and penetration versus aspiration, as evidenced by immediate coughing.  Current diet of Dys 1 textures and nectar thick liquids appear to be better managed, with no immediate signs of airway penetration. SLP did note a delayed cough prior to exiting the room, although RN reports baseline congested cough and need for nasotracheal suctioning throughout the day. Recommend to continue Dys 1 textures and nectar thick liquids by cup sips with full supervision. SLP to follow for diet tolerance.    Aspiration Risk  Moderate    Diet Recommendation Dysphagia 1 (Puree);Nectar-thick liquid   Liquid Administration via: Cup;No straw Medication Administration: Whole meds with puree Supervision: Staff to assist with self feeding;Full supervision/cueing for compensatory strategies Compensations: Slow rate;Small sips/bites Postural Changes and/or Swallow Maneuvers: Seated upright 90 degrees    Other  Recommendations Oral Care Recommendations: Oral care BID Other Recommendations: Order thickener from pharmacy;Prohibited food (jello, ice cream, thin soups);Remove water pitcher   Follow Up Recommendations  Other (comment) (TBD-per RN, GOC meeting to be held)    Frequency and Duration min 2x/week  1 week   Pertinent Vitals/Pain n/a    SLP Swallow Goals     Swallow Study Prior Functional Status       General Date of Onset: 11/03/13 HPI: Keith Conrad is an 80 y.o. male with PMH of advanced dementia, dysphagia, HTN and bradycardia. Patient brought in to the hospital from dementia unit at [facility] because of shortness of breath. In the ED CXR showed right lower lobe atelectasis versus infiltrates. He  has been consuming Dys 1 textures and nectar thick liquids. Type of Study: Bedside swallow evaluation Previous Swallow Assessment: none in chart (however on dysphagia diet) Diet Prior to this Study: Dysphagia 1 (puree);Nectar-thick liquids Temperature Spikes Noted: No Respiratory Status: Room air History of Recent Intubation: No Behavior/Cognition:  Alert;Cooperative;Requires cueing Self-Feeding Abilities: Total assist Patient Positioning: Upright in bed Baseline Vocal Quality: Clear Volitional Cough: Cognitively unable to elicit Volitional Swallow: Unable to elicit    Oral/Motor/Sensory Function     Ice Chips Ice chips: Not tested   Thin Liquid Thin Liquid: Impaired Presentation: Cup Oral Phase Impairments: Poor awareness of bolus Oral Phase Functional Implications: Right anterior spillage;Left anterior spillage Pharyngeal  Phase Impairments: Suspected delayed Swallow;Cough - Immediate    Nectar Thick Nectar Thick Liquid: Impaired Presentation: Cup Pharyngeal Phase Impairments: Suspected delayed Swallow;Cough - Delayed   Honey Thick Honey Thick Liquid: Not tested   Puree Puree: Impaired Presentation: Spoon Pharyngeal Phase Impairments: Suspected delayed Swallow;Cough - Delayed   Solid   GO    Solid: Impaired Presentation: Spoon Oral Phase Impairments: Poor awareness of bolus;Impaired mastication Pharyngeal Phase Impairments: Suspected delayed Swallow;Cough - Immediate       Germain Osgood, M.A. CCC-SLP [phone]  Germain Osgood 11/04/2013,4:05 PM

## 2013-11-04 NOTE — Progress Notes (Signed)
Patient suctioned for a large amount of thick white/yellow secretions. Tolerated procedure well with strong cough.

## 2013-11-04 NOTE — Progress Notes (Signed)
Patient HR drops to mid 30's frequently but unsustained. Pt asymptomatic. On call MD Doreene Burke, O made aware. No orders received.

## 2013-11-05 ENCOUNTER — Inpatient Hospital Stay (HOSPITAL_COMMUNITY): Payer: Medicare Other

## 2013-11-05 MED ORDER — MORPHINE SULFATE 2 MG/ML IJ SOLN
INTRAMUSCULAR | Status: AC
  Administered 2013-11-05: 2 mg via INTRAVENOUS
  Filled 2013-11-05: qty 1

## 2013-11-05 MED ORDER — FUROSEMIDE 10 MG/ML IJ SOLN
40.0000 mg | Freq: Once | INTRAMUSCULAR | Status: AC
  Administered 2013-11-05: 40 mg via INTRAVENOUS
  Filled 2013-11-05: qty 4

## 2013-11-05 MED ORDER — MORPHINE SULFATE 2 MG/ML IJ SOLN
2.0000 mg | INTRAMUSCULAR | Status: DC | PRN
Start: 2013-11-05 — End: 2013-11-06
  Administered 2013-11-05 (×2): 2 mg via INTRAVENOUS
  Filled 2013-11-05: qty 1

## 2013-11-05 MED ORDER — POTASSIUM CHLORIDE CRYS ER 20 MEQ PO TBCR
40.0000 meq | EXTENDED_RELEASE_TABLET | Freq: Once | ORAL | Status: AC
  Administered 2013-11-05: 40 meq via ORAL
  Filled 2013-11-05: qty 2

## 2013-11-05 MED ORDER — LORAZEPAM 2 MG/ML IJ SOLN
INTRAMUSCULAR | Status: AC
  Administered 2013-11-05: 1 mg via INTRAVENOUS
  Filled 2013-11-05: qty 1

## 2013-11-05 MED ORDER — HYDRALAZINE HCL 20 MG/ML IJ SOLN
5.0000 mg | Freq: Four times a day (QID) | INTRAMUSCULAR | Status: DC | PRN
  Administered 2013-11-05: 5 mg via INTRAVENOUS
  Filled 2013-11-05: qty 1

## 2013-11-05 MED ORDER — GUAIFENESIN 100 MG/5ML PO SYRP
300.0000 mg | ORAL_SOLUTION | Freq: Four times a day (QID) | ORAL | Status: DC
  Filled 2013-11-05 (×6): qty 15

## 2013-11-05 MED ORDER — LORAZEPAM 2 MG/ML IJ SOLN
1.0000 mg | INTRAMUSCULAR | Status: AC
  Administered 2013-11-05: 18:00:00 via INTRAVENOUS
  Administered 2013-11-05: 1 mg via INTRAVENOUS

## 2013-11-05 MED ORDER — ATROPINE SULFATE 1 % OP SOLN
2.0000 [drp] | Freq: Four times a day (QID) | OPHTHALMIC | Status: DC | PRN
  Filled 2013-11-05: qty 2

## 2013-11-05 MED ORDER — POTASSIUM CHLORIDE 20 MEQ PO PACK
40.0000 meq | PACK | Freq: Once | ORAL | Status: DC
Start: 2013-11-05 — End: 2013-11-05

## 2013-11-05 MED ORDER — LORAZEPAM 2 MG/ML IJ SOLN
0.5000 mg | Freq: Once | INTRAMUSCULAR | Status: AC
  Administered 2013-11-05: 0.5 mg via INTRAVENOUS
  Filled 2013-11-05: qty 1

## 2013-11-05 MED ORDER — METOPROLOL TARTRATE 1 MG/ML IV SOLN
5.0000 mg | Freq: Once | INTRAVENOUS | Status: AC
  Administered 2013-11-05: 5 mg via INTRAVENOUS
  Filled 2013-11-05: qty 5

## 2013-11-05 MED ORDER — ATROPINE SULFATE 1 % OP SOLN
2.0000 [drp] | Freq: Four times a day (QID) | OPHTHALMIC | Status: DC | PRN
  Filled 2013-11-05: qty 5

## 2013-11-05 NOTE — Progress Notes (Addendum)
Rt called to suction pt. Pt has lots of increase WOB, RR 38,HR 138. MD has been called by RN.  Pt had thick white sputum. Pt had no reaction to therapy. Medication given by RN. Pt has no improvement in vitals. MD paged again by RN. RN spoke with rapid response on phone they stated to keep calling the MD. Rt will follow orders as ordered. 2 lpm [place] was placed on pt sats 97%.

## 2013-11-05 NOTE — Progress Notes (Signed)
Pt BP 173/78 HR 65.  Asymptomatic. MD notified. New orders received.

## 2013-11-05 NOTE — Progress Notes (Signed)
[facility] PROGRESS NOTE   Keith Conrad:[id number] DOB: 10-Nov-1923 DOA: 11/03/2013 PCP: No primary provider on file.  HPI/Subjective: Seen with nursing staff at bedside. Patient followed simple commands, he took a deep breath and coughed when I asked him to.  Assessment/Plan: Principal Problem:   CAP (community acquired pneumonia) Active Problems:   CAD (coronary artery disease)   Hypertension   Dementia   Dysphagia   Malnutrition of moderate degree    Pneumonia  -Health care associated pneumonia versus aspiration pneumonia.  -Patient started on Levaquin yesterday, no much time for clinical response. -Started on Zosyn.  -Supportive management with broncho- dilators, mucolytics and antitussives, oxygen as needed.   Dementia  -Patient has advanced dementia, not on any dementia medicines.  -Patient lives in a dementia unit, he might needs SNF on discharge.   Dysphagia  -Patient is on pured diet with nectar thick liquids, family mentioned that he still coughs when he eats. -SLP to evaluate.  -Prolonged discussion with the family about poor prognosis when advanced dementia is complicated by dysphagia and pneumonia.   Bradycardia  -Patient has chronic bradycardia which been asymptomatic. No further workup.  -Patient bradycardia went down to the third is can discontinue Coreg and telemetry as he is DNR/DNI now.  Advanced directives -Discussed with his POA his son [name], patient will be DO NOT RESUSCITATE. -Asked palliative medicine team consultation for goals of care, await their recommendations.  Code Status: DNR Family Communication: Plan discussed with the patient. Disposition Plan: Remains inpatient   Consultants:  PMT  Procedures:  None  Antibiotics:  Zosyn   Objective: Filed Vitals:   11/05/13 0507  BP: 161/67  Pulse: 64  Temp: 98.3 F (36.8 C)  Resp: 20    Intake/Output Summary (Last 24 hours) at 11/05/13 1346 Last data filed  at 11/05/13 1017  Gross per 24 hour  Intake    720 ml  Output    600 ml  Net    120 ml   Filed Weights   11/03/13 1313  Weight: 55.4 kg (122 lb 2.2 oz)    Exam: General: Alert and awake, oriented x3, not in any acute distress. HEENT: anicteric sclera, pupils reactive to light and accommodation, EOMI CVS: S1-S2 clear, no murmur rubs or gallops Chest: clear to auscultation bilaterally, no wheezing, rales or rhonchi Abdomen: soft nontender, nondistended, normal bowel sounds, no organomegaly Extremities: no cyanosis, clubbing or edema noted bilaterally Neuro: Cranial nerves II-XII intact, no focal neurological deficits  Data Reviewed: Basic Metabolic Panel:  Recent Labs Lab 11/03/13 0843 11/04/13 0440  NA 137 142  K 4.2 3.7  CL 101 106  CO2 25 26  GLUCOSE 134* 120*  BUN 24* 21  CREATININE 1.06 1.13  CALCIUM 8.7 8.1*   Liver Function Tests: No results found for this basename: AST, ALT, ALKPHOS, BILITOT, PROT, ALBUMIN,  in the last 168 hours No results found for this basename: LIPASE, AMYLASE,  in the last 168 hours No results found for this basename: AMMONIA,  in the last 168 hours CBC:  Recent Labs Lab 11/03/13 0843 11/04/13 0440  WBC 11.9* 9.6  NEUTROABS 8.8*  --   HGB 12.6* 11.4*  HCT 37.5* 34.6*  MCV 91.5 94.0  PLT 325 298   Cardiac Enzymes:  Recent Labs Lab 11/03/13 0843  TROPONINI <0.30   BNP (last 3 results) No results found for this basename: PROBNP,  in the last 8760 hours CBG: No results found for this basename: GLUCAP,  in  the last 168 hours  Micro Recent Results (from the past 240 hour(s))  URINE CULTURE     Status: None   Collection Time    11/03/13  9:53 AM      Result Value Ref Range Status   Specimen Description URINE, CATHETERIZED   Final   Special Requests NONE   Final   Culture  Setup Time     Final   Value: 11/03/2013 15:01     Performed at [facility]     Final   Value: NO GROWTH     Performed at  [facility]   Culture     Final   Value: NO GROWTH     Performed at [facility]   Report Status 11/04/2013 FINAL   Final  MRSA PCR SCREENING     Status: None   Collection Time    11/03/13  1:03 PM      Result Value Ref Range Status   MRSA by PCR NEGATIVE  NEGATIVE Final   Comment:            The GeneXpert MRSA Assay (FDA     approved for NASAL specimens     only), is one component of a     comprehensive MRSA colonization     surveillance program. It is not     intended to diagnose MRSA     infection nor to guide or     monitor treatment for     MRSA infections.  CULTURE, BLOOD (ROUTINE X 2)     Status: None   Collection Time    11/03/13  1:24 PM      Result Value Ref Range Status   Specimen Description BLOOD RIGHT ARM   Final   Special Requests BOTTLES DRAWN AEROBIC AND ANAEROBIC 10CC   Final   Culture  Setup Time     Final   Value: 11/03/2013 21:44     Performed at [facility]   Culture     Final   Value:        BLOOD CULTURE RECEIVED NO GROWTH TO DATE CULTURE WILL BE HELD FOR 5 DAYS BEFORE ISSUING A FINAL NEGATIVE REPORT     Performed at [facility]   Report Status PENDING   Incomplete  CULTURE, BLOOD (ROUTINE X 2)     Status: None   Collection Time    11/03/13  1:36 PM      Result Value Ref Range Status   Specimen Description BLOOD RIGHT ARM   Final   Special Requests BOTTLES DRAWN AEROBIC AND ANAEROBIC [facility]   Final   Culture  Setup Time     Final   Value: 11/03/2013 21:42     Performed at [facility]   Culture     Final   Value:        BLOOD CULTURE RECEIVED NO GROWTH TO DATE CULTURE WILL BE HELD FOR 5 DAYS BEFORE ISSUING A FINAL NEGATIVE REPORT     Performed at [facility]   Report Status PENDING   Incomplete     Studies: No results found.  Scheduled Meds: . antiseptic oral rinse  7 mL Mouth Rinse q12n4p  . aspirin  81 mg Oral Q breakfast  . chlorhexidine  15 mL Mouth Rinse BID  . [START ON 11/06/2013]  citalopram  10 mg Oral Q M,W,F  . digoxin  0.125 mg Oral q morning - 10a  .  feeding supplement (ENSURE)  1 Container Oral TID BM  . guaiFENesin  1,200 mg Oral Q12H  . heparin  5,000 Units Subcutaneous 3 times per day  . lisinopril  5 mg Oral q morning - 10a  . piperacillin-tazobactam (ZOSYN)  IV  3.375 g Intravenous 3 times per day  . QUEtiapine  12.5 mg Oral QHS  . risperiDONE  0.25 mg Oral Daily   Continuous Infusions: . sodium chloride 75 mL/hr at 11/05/13 0335       Time spent: 35 minutes    [facility] A  [facility] Pager [phone] If 7PM-7AM, please contact night-coverage at [email or web address], password [facility] 11/05/2013, 1:46 PM  LOS: 2 days

## 2013-11-05 NOTE — Clinical Documentation Improvement (Signed)
Presents with Pneumonia, dysphagia, moderate Malnutrition.   High risk for aspiration noted  Patient has severe dementia with dysphagia; has been made DNR  Placed on Zosyn IV q8h  Please clarify the specific type of Pneumonia you are treating and document findings in next progress note and discharge summary.   Thank You, Zoila Shutter ,RN Clinical Documentation Specialist:  [place] [employer]

## 2013-11-05 NOTE — Progress Notes (Signed)
Pt respirations labored, shallow, retracting, increased work of breathing. Resp 38-40. HR 114-138. BP 167/109. Oxygen sats 97% on 2 LPM/[place]. Breath sounds rhonchi and wheezes.  Diaphoretic. Restless. MD called. Lasix 40 mg IV and Ativan 0.5 mg IV administered. Spoke with [facility] on phone for further instructions due to pt having no improvement. MD called for further orders.

## 2013-11-05 NOTE — Progress Notes (Signed)
Received palliative medicine consult for goals of care. Called and dicussed with the patient's son Jackey Housey, who agrees  to meet for goals of care at 2 :00 pm on 11/06/13

## 2013-11-06 DIAGNOSIS — E440 Moderate protein-calorie malnutrition: Secondary | ICD-10-CM

## 2013-11-06 DIAGNOSIS — Z8719 Personal history of other diseases of the digestive system: Secondary | ICD-10-CM

## 2013-11-06 DIAGNOSIS — Z515 Encounter for palliative care: Secondary | ICD-10-CM

## 2013-11-06 LAB — BASIC METABOLIC PANEL
Anion gap: 11 (ref 5–15)
BUN: 15 mg/dL (ref 6–23)
CALCIUM: 8.8 mg/dL (ref 8.4–10.5)
CHLORIDE: 104 meq/L (ref 96–112)
CO2: 29 meq/L (ref 19–32)
CREATININE: 1.19 mg/dL (ref 0.50–1.35)
GFR calc Af Amer: 61 mL/min — ABNORMAL LOW (ref >90)
GFR calc non Af Amer: 53 mL/min — ABNORMAL LOW (ref >90)
GLUCOSE: 107 mg/dL — AB (ref 70–99)
Potassium: 4.4 mEq/L (ref 3.7–5.3)
Sodium: 144 mEq/L (ref 137–147)

## 2013-11-06 LAB — CBC
HEMATOCRIT: 40.1 % (ref 39.0–52.0)
Hemoglobin: 13.3 g/dL (ref 13.0–17.0)
MCH: 30.9 pg (ref 26.0–34.0)
MCHC: 33.2 g/dL (ref 30.0–36.0)
MCV: 93 fL (ref 78.0–100.0)
Platelets: 343 10*3/uL (ref 150–400)
RBC: 4.31 MIL/uL (ref 4.22–5.81)
RDW: 13.6 % (ref 11.5–15.5)
WBC: 11.1 10*3/uL — ABNORMAL HIGH (ref 4.0–10.5)

## 2013-11-06 MED ORDER — BISACODYL 10 MG RE SUPP: 10.0000 mg | Freq: Every day | RECTAL | Status: DC | PRN

## 2013-11-06 MED ORDER — ACETAMINOPHEN 650 MG RE SUPP: 650.0000 mg | Freq: Four times a day (QID) | RECTAL | Status: DC | PRN

## 2013-11-06 MED ORDER — ATROPINE SULFATE 1 % OP SOLN
4.0000 [drp] | Freq: Four times a day (QID) | OPHTHALMIC | Status: DC | PRN
  Filled 2013-11-06: qty 2

## 2013-11-06 MED ORDER — MORPHINE SULFATE 2 MG/ML IJ SOLN
1.0000 mg | INTRAMUSCULAR | Status: DC | PRN
  Administered 2013-11-06: 1 mg via INTRAVENOUS

## 2013-11-06 MED ORDER — SCOPOLAMINE 1 MG/3DAYS TD PT72
1.0000 | MEDICATED_PATCH | TRANSDERMAL | Status: DC
  Administered 2013-11-06: 1.5 mg via TRANSDERMAL
  Filled 2013-11-06 (×2): qty 1

## 2013-11-06 MED ORDER — LORAZEPAM 2 MG/ML IJ SOLN
0.5000 mg | INTRAMUSCULAR | Status: DC | PRN
  Administered 2013-11-07: 0.5 mg via INTRAVENOUS
  Filled 2013-11-06: qty 1

## 2013-11-06 MED ORDER — MORPHINE SULFATE 2 MG/ML IJ SOLN
1.0000 mg | INTRAMUSCULAR | Status: DC | PRN
  Administered 2013-11-06 – 2013-11-07 (×2): 1 mg via INTRAVENOUS
  Filled 2013-11-06 (×2): qty 1

## 2013-11-06 NOTE — Progress Notes (Signed)
Speech Language Pathology Treatment: Dysphagia  Patient Details Name: MARTELL MCFADYEN MRN: [id number] DOB: 10/08/1923 Today's Date: 11/06/2013 Time: 1010-1020 SLP Time Calculation (min): 10 min  Assessment / Plan / Recommendation Clinical Impression  Pt's daughter in law present; education was completed with her. Dtr in Software engineer indicated pt is not taking much po, largely due to lethargy at this time. Pt requiring intermittent suctioning for secretion management, so NPO status is recommended if pt lethargic or unable to manage secretions independently. Dtr in law was encouraged to maintain elevated HOB at all times, as well as regular, thorough oral care to mitigate aspiration risk. Regardless, pt continues to be at high aspiration risk given advanced age, dementia, and currently poor responsiveness. Palliative care meeting is scheduled for this afternoon at 1400. ST will follow up after [place] established for education.    HPI HPI: Pt is an 80 y.o. male with PMH of advanced dementia, dysphagia, HTN and bradycardia. Patient brought in to the hospital from dementia unit at [facility] because of shortness of breath. In the ED CXR showed right lower lobe atelectasis versus infiltrates. Repeat CXR 11/05/13 revealed CHF superimposed on COPD with no focal PNA. BSE completed 11/04/13 with recommendation to continue dys 1/NTL diet. ST following up this date for diet tolerance and family education.   Pertinent Vitals  VSS  SLP Plan  Continue with current plan of care    Recommendations Diet recommendations: NPO (NPO if not fully awake and alert) Medication Administration: Crushed with puree              Oral Care Recommendations: Oral care BID Follow up Recommendations:  (will return to complete education following establishment of [place]) Plan: Continue with current plan of care    Cochran B. Quentin Ore [facility], CCC-SLP [phone]  Shonna Chock 11/06/2013, 10:29 AM

## 2013-11-06 NOTE — Progress Notes (Signed)
Clinical Social Work Department BRIEF PSYCHOSOCIAL ASSESSMENT 11/06/2013  Patient:  Keith Conrad, Keith Conrad     Account Number:  [id number]     Admit date:  11/03/2013  Clinical Social Worker:  Renold Genta  Date/Time:  11/06/2013 03:58 PM  Referred by:  Physician  Date Referred:  11/06/2013 Referred for  Residential hospice placement   Other Referral:   Interview type:  Family Other interview type:   patient's daughter-in-law, [name] at bedside    PSYCHOSOCIAL DATA Living Status:  [place] Admitted from facility:  [facility] Level of care:  Assisted Living Primary support name:  Sahand Gosch (son) h#: [phone] w#: [phone] c#: [id number] Primary support relationship to patient:  CHILD, ADULT Degree of support available:   good    CURRENT CONCERNS Current Concerns  Post-Acute Placement   Other Concerns:    SOCIAL WORK ASSESSMENT / PLAN CSW received consult from PMT NP, Elmo Putt to offer hospice choice for residential hospice placement.   Assessment/plan status:  Information/Referral to [facility] Other assessment/ plan:   Information/referral to community resources:   CSW provided list of residential hospices.    PATIENT'S/FAMILY'S RESPONSE TO PLAN OF CARE: Patient's daughter-in-law, [name] confirmed that son would prefer [facility] as they live in [place]. CSW made referral to [facility] Conte, [place] - awaiting call back re: eligibility/bed availability.       Raynaldo Opitz, [facility] Clinical Social Worker cell #: [phone]

## 2013-11-06 NOTE — Progress Notes (Signed)
[facility] PROGRESS NOTE   Keith Conrad RKY:[id number] DOB: 1923/07/07 DOA: 11/03/2013 PCP: No primary provider on file.  HPI/Subjective: Had respiratory distress in the evening. Spoke with his son on the phone, asked for no heroic measures. Morphine, Ativan and Lasix were given at that time. Patient looks okay without distress this morning. Palliative care to meet with family today at 2 PM.  Assessment/Plan: Principal Problem:   CAP (community acquired pneumonia) Active Problems:   CAD (coronary artery disease)   Hypertension   Dementia   Dysphagia   Malnutrition of moderate degree    Pneumonia  -Health care associated pneumonia versus aspiration pneumonia.  -Patient started on Levaquin yesterday, no much time for clinical response. -Started on Zosyn.  -Supportive management with broncho- dilators, mucolytics and antitussives, oxygen as needed.   Dementia  -Patient has advanced dementia, not on any dementia medicines.  -Patient lives in a dementia unit, he might needs SNF on discharge versus hospice home now.  Dysphagia  -Patient is on pured diet with nectar thick liquids, family mentioned that he still coughs when he eats. -SLP to evaluate.  -Prolonged discussion with the family about poor prognosis when advanced dementia is complicated by dysphagia and pneumonia.   Bradycardia  -Patient has chronic bradycardia which been asymptomatic. No further workup.  -Patient bradycardia went down to the third is can discontinue Coreg and telemetry as he is DNR/DNI now.  Advanced directives -Discussed with his POA his son [name], patient will be DO NOT RESUSCITATE. -Asked palliative medicine team consultation for goals of care, await their recommendations.  Code Status: DNR Family Communication: Plan discussed with the patient. Disposition Plan: Remains inpatient   Consultants:  PMT  Procedures:  None  Antibiotics:  Zosyn   Objective: Filed Vitals:     11/06/13 1439  BP: 115/80  Pulse: 87  Temp: 98.3 F (36.8 C)  Resp:     Intake/Output Summary (Last 24 hours) at 11/06/13 1444 Last data filed at 11/06/13 3151  Gross per 24 hour  Intake    100 ml  Output   2800 ml  Net  -2700 ml   Filed Weights   11/03/13 1313  Weight: 55.4 kg (122 lb 2.2 oz)    Exam: General: Alert and awake, oriented x3, not in any acute distress. HEENT: anicteric sclera, pupils reactive to light and accommodation, EOMI CVS: S1-S2 clear, no murmur rubs or gallops Chest: clear to auscultation bilaterally, no wheezing, rales or rhonchi Abdomen: soft nontender, nondistended, normal bowel sounds, no organomegaly Extremities: no cyanosis, clubbing or edema noted bilaterally Neuro: Cranial nerves II-XII intact, no focal neurological deficits  Data Reviewed: Basic Metabolic Panel:  Recent Labs Lab 11/03/13 0843 11/04/13 0440 11/06/13 0408  NA 137 142 144  K 4.2 3.7 4.4  CL 101 106 104  CO2 25 26 29   GLUCOSE 134* 120* 107*  BUN 24* 21 15  CREATININE 1.06 1.13 1.19  CALCIUM 8.7 8.1* 8.8   Liver Function Tests: No results found for this basename: AST, ALT, ALKPHOS, BILITOT, PROT, ALBUMIN,  in the last 168 hours No results found for this basename: LIPASE, AMYLASE,  in the last 168 hours No results found for this basename: AMMONIA,  in the last 168 hours CBC:  Recent Labs Lab 11/03/13 0843 11/04/13 0440 11/06/13 0408  WBC 11.9* 9.6 11.1*  NEUTROABS 8.8*  --   --   HGB 12.6* 11.4* 13.3  HCT 37.5* 34.6* 40.1  MCV 91.5 94.0 93.0  PLT 325 298  343   Cardiac Enzymes:  Recent Labs Lab 11/03/13 0843  TROPONINI <0.30   BNP (last 3 results) No results found for this basename: PROBNP,  in the last 8760 hours CBG: No results found for this basename: GLUCAP,  in the last 168 hours  Micro Recent Results (from the past 240 hour(s))  URINE CULTURE     Status: None   Collection Time    11/03/13  9:53 AM      Result Value Ref Range Status    Specimen Description URINE, CATHETERIZED   Final   Special Requests NONE   Final   Culture  Setup Time     Final   Value: 11/03/2013 15:01     Performed at [facility]     Final   Value: NO GROWTH     Performed at [facility]   Culture     Final   Value: NO GROWTH     Performed at [facility]   Report Status 11/04/2013 FINAL   Final  MRSA PCR SCREENING     Status: None   Collection Time    11/03/13  1:03 PM      Result Value Ref Range Status   MRSA by PCR NEGATIVE  NEGATIVE Final   Comment:            The GeneXpert MRSA Assay (FDA     approved for NASAL specimens     only), is one component of a     comprehensive MRSA colonization     surveillance program. It is not     intended to diagnose MRSA     infection nor to guide or     monitor treatment for     MRSA infections.  CULTURE, BLOOD (ROUTINE X 2)     Status: None   Collection Time    11/03/13  1:24 PM      Result Value Ref Range Status   Specimen Description BLOOD RIGHT ARM   Final   Special Requests BOTTLES DRAWN AEROBIC AND ANAEROBIC 10CC   Final   Culture  Setup Time     Final   Value: 11/03/2013 21:44     Performed at [facility]   Culture     Final   Value:        BLOOD CULTURE RECEIVED NO GROWTH TO DATE CULTURE WILL BE HELD FOR 5 DAYS BEFORE ISSUING A FINAL NEGATIVE REPORT     Performed at [facility]   Report Status PENDING   Incomplete  CULTURE, BLOOD (ROUTINE X 2)     Status: None   Collection Time    11/03/13  1:36 PM      Result Value Ref Range Status   Specimen Description BLOOD RIGHT ARM   Final   Special Requests BOTTLES DRAWN AEROBIC AND ANAEROBIC [facility]   Final   Culture  Setup Time     Final   Value: 11/03/2013 21:42     Performed at [facility]   Culture     Final   Value:        BLOOD CULTURE RECEIVED NO GROWTH TO DATE CULTURE WILL BE HELD FOR 5 DAYS BEFORE ISSUING A FINAL NEGATIVE REPORT     Performed at [facility]    Report Status PENDING   Incomplete     Studies: Dg Chest Port 1 View  11/05/2013   CLINICAL DATA:  Shortness of breath with  history of coronary artery disease, cardiomyopathy and CHF. No history of tobacco use.  EXAM: PORTABLE CHEST - 1 VIEW  COMPARISON:  PA and lateral chest of November 03, 2013  FINDINGS: The lungs are well-expanded. There is minimal blunting of the right lateral costophrenic angle which is not entirely new. The pulmonary vascularity is engorged in the interstitial markings are increased bilaterally. The cardiopericardial silhouette is top-normal in size but stable. There is no pleural effusion. The bony thorax is unremarkable.  IMPRESSION: The findings are consistent with CHF superimposed upon COPD. There is no focal pneumonia.   Electronically Signed   By: David  Martinique   On: 11/05/2013 18:10    Scheduled Meds: . antiseptic oral rinse  7 mL Mouth Rinse q12n4p  . aspirin  81 mg Oral Q breakfast  . chlorhexidine  15 mL Mouth Rinse BID  . citalopram  10 mg Oral Q M,W,F  . digoxin  0.125 mg Oral q morning - 10a  . feeding supplement (ENSURE)  1 Container Oral TID BM  . guaifenesin  300 mg Oral Q6H  . heparin  5,000 Units Subcutaneous 3 times per day  . lisinopril  5 mg Oral q morning - 10a  . piperacillin-tazobactam (ZOSYN)  IV  3.375 g Intravenous 3 times per day  . QUEtiapine  12.5 mg Oral QHS  . risperiDONE  0.25 mg Oral Daily   Continuous Infusions:       Time spent: 35 minutes    [facility] A  [facility] Pager [phone] If 7PM-7AM, please contact night-coverage at [email or web address], password [facility] 11/06/2013, 2:44 PM  LOS: 3 days

## 2013-11-06 NOTE — Progress Notes (Signed)
ANTIBIOTIC CONSULT NOTE - Follow-up  Pharmacy Consult for Zosyn Indication: HCAP vs Aspiration PNA  No Known Allergies  Patient Measurements: Height: 5\' 4"  (162.6 cm) Weight: 122 lb 2.2 oz (55.4 kg) IBW/kg (Calculated) : 59.2  Vital Signs: Temp: 98.2 F (36.8 C) (09/21 0459) Temp src: Oral (09/21 0459) BP: 120/67 mmHg (09/21 0459) Pulse Rate: 86 (09/21 0459) Intake/Output from previous day: 09/20 0701 - 09/21 0700 In: 2135 [P.O.:60; I.V.:1725; IV Piggyback:350] Out: 2800 [Urine:2800] Intake/Output from this shift:    Labs:  Recent Labs  11/04/13 0440 11/06/13 0408  WBC 9.6 11.1*  HGB 11.4* 13.3  PLT 298 343  CREATININE 1.13 1.19   Estimated Creatinine Clearance: 33.6 ml/min (by C-G formula based on Cr of 1.19). No results found for this basename: Letta Median, VANCORANDOM, GENTTROUGH, GENTPEAK, GENTRANDOM, TOBRATROUGH, TOBRAPEAK, TOBRARND, AMIKACINPEAK, AMIKACINTROU, AMIKACIN,  in the last 72 hours   Microbiology: Recent Results (from the past 720 hour(s))  URINE CULTURE     Status: None   Collection Time    11/03/13  9:53 AM      Result Value Ref Range Status   Specimen Description URINE, CATHETERIZED   Final   Special Requests NONE   Final   Culture  Setup Time     Final   Value: 11/03/2013 15:01     Performed at [place]     Final   Value: NO GROWTH     Performed at [facility]   Culture     Final   Value: NO GROWTH     Performed at [facility]   Report Status 11/04/2013 FINAL   Final  MRSA PCR SCREENING     Status: None   Collection Time    11/03/13  1:03 PM      Result Value Ref Range Status   MRSA by PCR NEGATIVE  NEGATIVE Final   Comment:            The GeneXpert MRSA Assay (FDA     approved for NASAL specimens     only), is one component of a     comprehensive MRSA colonization     surveillance program. It is not     intended to diagnose MRSA     infection nor to guide or     monitor  treatment for     MRSA infections.  CULTURE, BLOOD (ROUTINE X 2)     Status: None   Collection Time    11/03/13  1:24 PM      Result Value Ref Range Status   Specimen Description BLOOD RIGHT ARM   Final   Special Requests BOTTLES DRAWN AEROBIC AND ANAEROBIC 10CC   Final   Culture  Setup Time     Final   Value: 11/03/2013 21:44     Performed at [facility]   Culture     Final   Value:        BLOOD CULTURE RECEIVED NO GROWTH TO DATE CULTURE WILL BE HELD FOR 5 DAYS BEFORE ISSUING A FINAL NEGATIVE REPORT     Performed at [facility]   Report Status PENDING   Incomplete  CULTURE, BLOOD (ROUTINE X 2)     Status: None   Collection Time    11/03/13  1:36 PM      Result Value Ref Range Status   Specimen Description BLOOD RIGHT ARM   Final   Special Requests BOTTLES DRAWN AEROBIC AND ANAEROBIC  [facility]   Final   Culture  Setup Time     Final   Value: 11/03/2013 21:42     Performed at [facility]   Culture     Final   Value:        BLOOD CULTURE RECEIVED NO GROWTH TO DATE CULTURE WILL BE HELD FOR 5 DAYS BEFORE ISSUING A FINAL NEGATIVE REPORT     Performed at [facility]   Report Status PENDING   Incomplete    Medical History: Past Medical History  Diagnosis Date  . CAD (coronary artery disease)     Catheterization September, 2009, total RCA, left to right collaterals, 90% diagonal, EF 10-15%  /   EF improved clinically over time  . Cardiomyopathy     EF 10-15% September, 2009  /  improved, echo, November, 2009, EF 50%,  posterior hypokinesis  . Congestive heart failure     class II to III  . GERD (gastroesophageal reflux disease)   . History of ulcerative colitis   . Bradycardia   . Hyperlipidemia   . Hypertension   . Chronotropic incompetence     Assess by Dr. Caryl Comes 2009, treadmill September, 2009 maximal heart rate 88  /   treadmill December, 2009, maximum heart rate 103, no further evaluation  . Ejection fraction     EF 10-15%, September, 2009  /  iimproved, EF 50%, echo, November, 2009  /   EF 55%, nuclear, January, 2012  . Malignancy     colonic  . Dementia    Assessment: Keith Conrad from [facility] dementia unit with CC of cough/SOB and lethargy.  PMHx pertinent for advanced dementia, HTN, and bradycardia.  Pt was started on levaquin 9/17.  CXR in ED shows RLL atelectasis vs infiltrates.  Pharmacy consulted to start zosyn for HCAP vs aspiration PNA.  NKDA.   9/17 >> Levaquin >> 9/18 9/18 >> Zosyn  >>    Tmax: Afebrile WBCs: improved to wnl Renal: SCr 1.19, 33CG, 43N  9/18 blood x2: ngtd 9/18 urine: NGF 9/18 strep pneumo ag - negative. legionella ag - negative  Goal of Therapy:  Appropriate antibiotic dosing for renal function; eradication of infection  Plan:  Continue Zosyn 3.375g IV q8h (infuse over 4 hours) Follow-up renal fxn, microbiology, clinical course   Kizzie Furnish, PharmD Pager: [phone] 11/06/2013 11:21 AM

## 2013-11-06 NOTE — Progress Notes (Signed)
PT Cancellation Note  Patient Details Name: Keith Conrad MRN: [id number] DOB: February 07, 1924   Cancelled Treatment:    Reason Eval/Treat Not Completed: Fatigue/lethargy limiting ability to participate Pt sleeping upon entering room, daughter in law reports pt resting comfortably (medicated). Discussed role of PT with daughter in law who reports pt was ambulatory with RW approx a week ago however had 2 falls at facility and since has only been assisted with transfers to w/c.  Daughter in law would like pt to rest today and have [place] meeting prior to PT.     Aureliano Oshields,KATHrine E 11/06/2013, 10:31 AM Carmelia Bake, PT, DPT 11/06/2013 Pager: [phone]

## 2013-11-06 NOTE — Progress Notes (Signed)
Patient suctioned for a lrg. Amt. Very thick white secretions. Vital signs stable post procedure

## 2013-11-06 NOTE — Progress Notes (Signed)
Spoke w/ pt's daughter-in-law (pt's son's wife). She says they are doing all right and that son has some ideas of what he'd like in terms of goals of care mtg this afternoon. I let her know that they can ask for a chaplain if they would like some support.   Akil, [place] 11/06/2013 10:33 AM

## 2013-11-06 NOTE — Consult Note (Signed)
Patient Keith Conrad      DOB: 01-03-1924      ZHG:[id number]     Consult Note from the Palliative Medicine Team at [facility] [place] Requested by: Dr. Hartford Poli     PCP: No primary provider on file. Reason for Consultation: GOC and options    Phone Number:None  Assessment of patients Current state:  I met today with Mr. Eagon son, [name] and his wife [name]. They tell me that he has been declining at home and has been living at [facility] dementia unit for 13 months. [name] tells me that he has noticed coughing for a few months now that would worsen and better over time. His ambulation was worsening and he was wheelchair bound 3-4 weeks prior to Thanksgiving and was improving afterwards until 2-3 weeks ago when he has fallen twice and again in wheelchair last 7-10 days. He has had weight loss. They were hopeful on Saturday as they thought that he was beginning to improve but has since declined. They acknowledge his aspiration and risk of aspiration on his own secretions. They understand that this is unlikely to improve greatly and that his quality of life is poor. [name] tells me that he has decided comfort measures is the best option for his father. He approves of focusing on comfort without pursuing further testing, IV fluids, antibiotics. We have also minimized medications due to aspiration risk and discomfort. They understand that prognosis is very poor likely days and they believe hospice facility would be the best option for them.    Goals of Care: 1.  Code Status: DNR   2. Scope of Treatment: Full comfort measures. Family has agreed to transition to full comfort care and have decided not to continue antibiotics, IV fluids, labs but to treat symptoms to make [name] comfortable. Will minimize medications as risk of aspiration of po meds outweigh benefit of medications as discussed with family.    4. Disposition: Hopeful for hospice facility.    3. Symptom  Management:   1. Anxiety/Agitation: Lorazepam 0.5 mg IV every 4 hours prn.  2. Pain: Morphine 1 mg IV every hour prn pain/dyspnea.  3. Bowel Regimen: Dulcolax supp daily prn.  4. Fever: Acetaminophen supp 650 mg every 6 hoours prn.  5. Terminal Secretions: Scopolamine patch. Atropine SL 4 drops every 4 hours prn.   4. Psychosocial: Emotional support provided to patient and to family during difficult conversation.   5. Spiritual: Chaplain following.     Patient Documents Completed or Given: Document Given Completed  Advanced Directives Pkt    MOST    DNR    Gone from My [facility]ight    Hard Choices yes     Brief HPI: 80 yo male with advanced dementia and worsening dysphagia and likely aspiration pneumonia. PMH significant for CAD, heart failure (EF 10-15%), hypertension, hyperlipidemia, colon malignancy.    ROS: Unable to assess - nonverbal/min responsive.     PMH:  Past Medical History  Diagnosis Date  . CAD (coronary artery disease)     Catheterization September, 2009, total RCA, left to right collaterals, 90% diagonal, EF 10-15%  /   EF improved clinically over time  . Cardiomyopathy     EF 10-15% September, 2009  /  improved, echo, November, 2009, EF 50%,  posterior hypokinesis  . Congestive heart failure     class II to III  . GERD (gastroesophageal reflux disease)   . History of ulcerative colitis   .  Bradycardia   . Hyperlipidemia   . Hypertension   . Chronotropic incompetence     Assess by Dr. Caryl Comes 2009, treadmill September, 2009 maximal heart rate 88  /   treadmill December, 2009, maximum heart rate 103, no further evaluation  . Ejection fraction     EF 10-15%, September, 2009  / iimproved, EF 50%, echo, November, 2009  /   EF 55%, nuclear, January, 2012  . Malignancy     colonic  . Dementia      PSH: Past Surgical History  Procedure Laterality Date  . Tonsillectomy    . Colectomy     I have reviewed the FH and SH and  If appropriate update it with new  information. No Known Allergies Scheduled Meds: . antiseptic oral rinse  7 mL Mouth Rinse q12n4p  . aspirin  81 mg Oral Q breakfast  . chlorhexidine  15 mL Mouth Rinse BID  . citalopram  10 mg Oral Q M,W,F  . digoxin  0.125 mg Oral q morning - 10a  . feeding supplement (ENSURE)  1 Container Oral TID BM  . guaifenesin  300 mg Oral Q6H  . heparin  5,000 Units Subcutaneous 3 times per day  . lisinopril  5 mg Oral q morning - 10a  . piperacillin-tazobactam (ZOSYN)  IV  3.375 g Intravenous 3 times per day  . QUEtiapine  12.5 mg Oral QHS  . risperiDONE  0.25 mg Oral Daily   Continuous Infusions:  PRN Meds:.atropine, hydrALAZINE, hydrocortisone, morphine injection, risperiDONE    BP 115/80  Pulse 87  Temp(Src) 98.3 F (36.8 C) (Oral)  Resp 18  Ht _0  (1.626 m)  Wt 55.4 kg (122 lb 2.2 oz)  BMI 20.95 kg/m2  SpO2 97%   PPS: 20%   Intake/Output Summary (Last 24 hours) at 11/06/13 1452 Last data filed at 11/06/13 0900  Gross per 24 hour  Intake    100 ml  Output   2800 ml  Net  -2700 ml   LBM: 11/05/13                      Physical Exam:  General: NAD, eyes closed, thin HEENT: Temporal muscle wasting, +JVD, moist mucous membranes Chest: Rhonchi throughout, bibasilar rales CVS: RRR, S1 S2 Abdomen: Soft, NT, ND, +BS Ext: No edema, skin dry, no cyanosis Neuro: Minimally responsive, nonverbal, does not follow commands  Labs: CBC    Component Value Date/Time   WBC 11.1* 11/06/2013 0408   RBC 4.31 11/06/2013 0408   HGB 13.3 11/06/2013 0408   HCT 40.1 11/06/2013 0408   PLT 343 11/06/2013 0408   MCV 93.0 11/06/2013 0408   MCH 30.9 11/06/2013 0408   MCHC 33.2 11/06/2013 0408   RDW 13.6 11/06/2013 0408   LYMPHSABS 1.5 11/03/2013 0843   MONOABS 1.2* 11/03/2013 0843   EOSABS 0.5 11/03/2013 0843   BASOSABS 0.0 11/03/2013 0843    BMET    Component Value Date/Time   NA 144 11/06/2013 0408   K 4.4 11/06/2013 0408   CL 104 11/06/2013 0408   CO2 29 11/06/2013 0408   GLUCOSE 107*  11/06/2013 0408   BUN 15 11/06/2013 0408   CREATININE 1.19 11/06/2013 0408   CREATININE 1.01 02/25/2011 0814   CALCIUM 8.8 11/06/2013 0408   GFRNONAA 53* 11/06/2013 0408   GFRNONAA 68 02/25/2011 0814   GFRAA 61* 11/06/2013 0408   GFRAA 78 02/25/2011 0814    CMP     Component Value  Date/Time   NA 144 11/06/2013 0408   K 4.4 11/06/2013 0408   CL 104 11/06/2013 0408   CO2 29 11/06/2013 0408   GLUCOSE 107* 11/06/2013 0408   BUN 15 11/06/2013 0408   CREATININE 1.19 11/06/2013 0408   CREATININE 1.01 02/25/2011 0814   CALCIUM 8.8 11/06/2013 0408   PROT 6.2 05/31/2013 2215   ALBUMIN 3.3* 05/31/2013 2215   AST 14 05/31/2013 2215   ALT 12 05/31/2013 2215   ALKPHOS 78 05/31/2013 2215   BILITOT 0.3 05/31/2013 2215   GFRNONAA 53* 11/06/2013 0408   GFRNONAA 68 02/25/2011 0814   GFRAA 61* 11/06/2013 0408   GFRAA 78 02/25/2011 0814    Time In Time Out Total Time Spent with Patient Total Overall Time  1355 1510 74mn 718m    Greater than 50%  of this time was spent counseling and coordinating care related to the above assessment and plan.  AlVinie SillNP Palliative Medicine Team Pager # [phone]-F 8a-5p) Team Phone # [phone]/Weekends)

## 2013-11-07 DIAGNOSIS — Z515 Encounter for palliative care: Secondary | ICD-10-CM

## 2013-11-07 DIAGNOSIS — B370 Candidal stomatitis: Secondary | ICD-10-CM

## 2013-11-07 MED ORDER — LORAZEPAM 2 MG/ML PO CONC: 1.0000 mg | Freq: Four times a day (QID) | ORAL | Status: AC | PRN

## 2013-11-07 MED ORDER — MORPHINE SULFATE (CONCENTRATE) 20 MG/ML PO SOLN: 5.0000 mg | ORAL | Status: AC | PRN

## 2013-11-07 MED ORDER — ATROPINE SULFATE 1 % OP SOLN: 1.0000 [drp] | Freq: Three times a day (TID) | OPHTHALMIC | Status: AC

## 2013-11-07 NOTE — Care Management Note (Signed)
    Page 1 of 1   11/07/2013     12:46:41 PM CARE MANAGEMENT NOTE 11/07/2013  Patient:  SIRCHARLES, HOLZHEIMER   Account Number:  [id number]  Date Initiated:  11/07/2013  Documentation initiated by:  [facility]  Subjective/Objective Assessment:   80 Y/O M ADMITTED W/PNA.HX:DNR.     Action/Plan:   FROM HOME.   Anticipated DC Date:  11/07/2013   Anticipated DC Plan:  [place]  CM consult      Choice offered to / List presented to:             Status of service:  Completed, signed off Medicare Important Message given?  YES (If response is "NO", the following Medicare IM given date fields will be blank) Date Medicare IM given:  11/07/2013 Medicare IM given by:  [facility] Date Additional Medicare IM given:   Additional Medicare IM given by:    Discharge Disposition:  [place]  Per UR Regulation:  Reviewed for med. necessity/level of care/duration of stay  If discussed at [place] of Stay Meetings, dates discussed:    Comments:  11/07/13 Hadrian Yarbrough RN,BSN NCM [phone] D/C [facility].

## 2013-11-07 NOTE — Consult Note (Signed)
[facility] Liaison: Received request from [place] for family interest in [facility]. Chart reviewed. [employer] available today. Have spoken with son [name] by phone. Plan to meet at noon to complete transfer paper work for transfer today. Dr. Orpah Melter to assume care at [facility] per family choice. Will need DC summary faxed to [phone] and RN to call report to [phone]. Thank you. Erling Conte LCSW [phone]

## 2013-11-07 NOTE — Progress Notes (Signed)
Patient is set to discharge to [facility] today. Patient & family aware, CSW spoke with patient's daughter-in-law, [name]. Discharge packet at [place], Tess aware. [facility] called for transport.   Raynaldo Opitz, [facility] Clinical Social Worker cell #: [phone]

## 2013-11-07 NOTE — Discharge Summary (Addendum)
Physician Discharge Summary  Keith Conrad DDU:[id number] DOB: 12-28-1923 DOA: 11/03/2013  PCP: No primary provider on file.  Admit date: 11/03/2013 Discharge date: 11/07/2013  Time spent: 40 minutes  Recommendations for Outpatient Follow-up:  1. Follow with the hospice home M.D.  Discharge Diagnoses:  Principal Problem:   CAP (community acquired pneumonia) Active Problems:   CAD (coronary artery disease)   Hypertension   Dementia   Dysphagia   Malnutrition of moderate degree   Palliative care encounter   Discharge Condition: Fair  Diet recommendation: Dysphagia with nectar thick liquids  Filed Weights   11/03/13 1313  Weight: 55.4 kg (122 lb 2.2 oz)    History of present illness:  Keith Conrad is a 80 y.o. male with past medical history of advanced dementia, HTN and bradycardia. Patient brought in to the hospital because of shortness of breath. Patient lives in [facility] dementia unit, he was noticed to be lethargic and less active for the past 10 days, for the past 2 days he started to have some cough and shortness of breath. He was seen by the physician in the nursing home and Levaquin was prescribed yesterday according to his family. The patient brought to the emergency department because of SOB and cough.  In the ED CXR showed right lower lobe atelectasis versus infiltrates. Patient has slightleukocytosis of 11.9. Patient will be admitted to the hospital for further evaluation.  Hospital Course:   Pneumonia  -Health care associated pneumonia versus aspiration pneumonia.  -Patient started on Levaquin prior to admission, no much time for clinical response.  -Started on Zosyn.  -Supportive management with broncho- dilators, mucolytics and antitussives, oxygen as needed.  -At the time of discharge: That is continued because for full comfort.  Dementia  -Patient has advanced dementia, not on any dementia medicines.  -Patient used to live in dementia unit, going  to hospice home.   Dysphagia  -Patient is on pured diet with nectar thick liquids, family mentioned that he still coughs when he eats.  -SLP to evaluate.  -Prolonged discussion with the family about poor prognosis when advanced dementia is complicated by dysphagia and pneumonia.   Bradycardia  -Patient has chronic bradycardia which been asymptomatic. No further workup.  -Patient bradycardia went down to the third is can discontinue Coreg and telemetry as he is DNR/DNI now.   Advanced directives  -Discussed with his POA his son [name], patient will be DO NOT RESUSCITATE.  -Consulted palliative medicine team for goals of care, they met with the family on 9/21. -Family decided to transition to full comfort, all labs, medications except for comfort discontinued. -Patient will be discharged to [facility] hosp[facility] home   Procedures:  Number  Consultations:  Palliative medicine team  Discharge Exam: Filed Vitals:   11/07/13 0358  BP: 155/67  Pulse: 83  Temp: 98.7 F (37.1 C)  Resp: 18   General: Lethargic, he is easy to arouse. HEENT: anicteric sclera, pupils reactive to light and accommodation, EOMI CVS: S1-S2 clear, no murmur rubs or gallops Chest: clear to auscultation bilaterally, no wheezing, rales or rhonchi Abdomen: soft nontender, nondistended, normal bowel sounds, no organomegaly Extremities: no cyanosis, clubbing or edema noted bilaterally Neuro: Cranial nerves II-XII intact, no focal neurological deficits  Discharge Instructions You were cared for by a hospitalist during your hospital stay. If you have any questions about your discharge medications or the care you received while you were in the hospital after you are discharged, you can call the unit  and asked to speak with the hospitalist on call if the hospitalist that took care of you is not available. Once you are discharged, your primary care physician will handle any further medical issues. Please note that NO  REFILLS for any discharge medications will be authorized once you are discharged, as it is imperative that you return to your primary care physician (or establish a relationship with a primary care physician if you do not have one) for your aftercare needs so that they can reassess your need for medications and monitor your lab values.  Discharge Instructions   Diet - low sodium heart healthy    Complete by:  As directed      Increase activity slowly    Complete by:  As directed           Current Discharge Medication List    START taking these medications   Details  atropine 1 % ophthalmic solution Place 1 drop under the tongue 3 (three) times daily. Qty: 2 mL, Refills: 0    LORazepam (LORAZEPAM INTENSOL) 2 MG/ML concentrated solution Take 0.5 mLs (1 mg total) by mouth every 6 (six) hours as needed for anxiety. Qty: 30 mL, Refills: 0    morphine (ROXANOL) 20 MG/ML concentrated solution Take 0.25 mLs (5 mg total) by mouth every 2 (two) hours as needed for severe pain. Qty: 30 mL, Refills: 0      STOP taking these medications     acetaminophen (TYLENOL) 325 MG tablet      aspirin 81 MG chewable tablet      B Complex-C (B-COMPLEX WITH VITAMIN C) tablet      carvedilol (COREG) 3.125 MG tablet      citalopram (CELEXA) 10 MG tablet      digoxin (LANOXIN) 0.125 MG tablet      feeding supplement (ENSURE IMMUNE HEALTH) LIQD      furosemide (LASIX) 20 MG tablet      guaiFENesin (MUCINEX) 600 MG 12 hr tablet      guaifenesin (ROBITUSSIN) 100 MG/5ML syrup      hydrocortisone (ANUCORT-HC) 25 MG suppository      levofloxacin (LEVAQUIN) 500 MG tablet      lisinopril (PRINIVIL,ZESTRIL) 5 MG tablet      loperamide (IMODIUM) 2 MG capsule      psyllium (REGULOID) 0.52 G capsule      QUEtiapine (SEROQUEL) 25 MG tablet      risperiDONE (RISPERDAL) 0.5 MG tablet        No Known Allergies    The results of significant diagnostics from this hospitalization (including  imaging, microbiology, ancillary and laboratory) are listed below for reference.    Significant Diagnostic Studies: Dg Chest 2 View  11/03/2013   CLINICAL DATA:  Shortness of breath and cough  EXAM: CHEST  2 VIEW  COMPARISON:  05/31/2013  FINDINGS: Cardiac shadow is within normal limits. The lungs are well aerated. Mild right basilar atelectasis is seen. A chronic compression deformity is again noted in the thoracic spine.  IMPRESSION: New right basilar atelectasis.   Electronically Signed   By: Inez Catalina M.D.   On: 11/03/2013 09:11   Ct Head Wo Contrast  10/26/2013   CLINICAL DATA:  [place] today, laceration the RIGHT temple, confusion, history dementia  EXAM: CT HEAD WITHOUT CONTRAST  TECHNIQUE: Contiguous axial images were obtained from the base of the skull through the vertex without intravenous contrast.  COMPARISON:  05/31/2013; correlation MRI brain 12/24/2007  FINDINGS: Generalized atrophy.  Chronic dilatation  of the temporal horn of the RIGHT lateral ventricle unchanged since 2009.  Small vessel chronic ischemic changes deep cerebral white matter.  No midline shift or mass effect.  No intracranial hemorrhage, mass lesion or evidence acute infarction.  No extra-axial fluid collections.  Atherosclerotic calcification of internal carotid and vertebral arteries at skullbase.  Slight deformity of lateral wall of RIGHT orbit image 1, not seen on prior CT.  Soft tissue swelling/hematoma overlies the lateral aspect of the LEFT orbital wall.  No additional bone or sinus abnormalities identified.  IMPRESSION: Atrophy with small vessel chronic ischemic changes of deep cerebral white matter.  Chronic dilatation of the temporal horn of the RIGHT lateral ventricle, of uncertain etiology but stable since 2009.  No acute intracranial abnormalities.  Slight contour deformity of the lateral wall the RIGHT orbit not definitely seen on prior CT, question lateral wall RIGHT orbital fracture.   Electronically Signed   By:  Lavonia Dana M.D.   On: 10/26/2013 19:51   Dg Chest Port 1 View  11/05/2013   CLINICAL DATA:  Shortness of breath with history of coronary artery disease, cardiomyopathy and CHF. No history of tobacco use.  EXAM: PORTABLE CHEST - 1 VIEW  COMPARISON:  PA and lateral chest of November 03, 2013  FINDINGS: The lungs are well-expanded. There is minimal blunting of the right lateral costophrenic angle which is not entirely new. The pulmonary vascularity is engorged in the interstitial markings are increased bilaterally. The cardiopericardial silhouette is top-normal in size but stable. There is no pleural effusion. The bony thorax is unremarkable.  IMPRESSION: The findings are consistent with CHF superimposed upon COPD. There is no focal pneumonia.   Electronically Signed   By: David  Martinique   On: 11/05/2013 18:10    Microbiology: Recent Results (from the past 240 hour(s))  URINE CULTURE     Status: None   Collection Time    11/03/13  9:53 AM      Result Value Ref Range Status   Specimen Description URINE, CATHETERIZED   Final   Special Requests NONE   Final   Culture  Setup Time     Final   Value: 11/03/2013 15:01     Performed at [facility]     Final   Value: NO GROWTH     Performed at [facility]   Culture     Final   Value: NO GROWTH     Performed at [facility]   Report Status 11/04/2013 FINAL   Final  MRSA PCR SCREENING     Status: None   Collection Time    11/03/13  1:03 PM      Result Value Ref Range Status   MRSA by PCR NEGATIVE  NEGATIVE Final   Comment:            The GeneXpert MRSA Assay (FDA     approved for NASAL specimens     only), is one component of a     comprehensive MRSA colonization     surveillance program. It is not     intended to diagnose MRSA     infection nor to guide or     monitor treatment for     MRSA infections.  CULTURE, BLOOD (ROUTINE X 2)     Status: None   Collection Time    11/03/13  1:24 PM       Result Value Ref Range Status   Specimen Description  BLOOD RIGHT ARM   Final   Special Requests BOTTLES DRAWN AEROBIC AND ANAEROBIC 10CC   Final   Culture  Setup Time     Final   Value: 11/03/2013 21:44     Performed at [facility]   Culture     Final   Value:        BLOOD CULTURE RECEIVED NO GROWTH TO DATE CULTURE WILL BE HELD FOR 5 DAYS BEFORE ISSUING A FINAL NEGATIVE REPORT     Performed at [facility]   Report Status PENDING   Incomplete  CULTURE, BLOOD (ROUTINE X 2)     Status: None   Collection Time    11/03/13  1:36 PM      Result Value Ref Range Status   Specimen Description BLOOD RIGHT ARM   Final   Special Requests BOTTLES DRAWN AEROBIC AND ANAEROBIC [place]   Final   Culture  Setup Time     Final   Value: 11/03/2013 21:42     Performed at [facility]   Culture     Final   Value:        BLOOD CULTURE RECEIVED NO GROWTH TO DATE CULTURE WILL BE HELD FOR 5 DAYS BEFORE ISSUING A FINAL NEGATIVE REPORT     Performed at [facility]   Report Status PENDING   Incomplete     Labs: Basic Metabolic Panel:  Recent Labs Lab 11/03/13 0843 11/04/13 0440 11/06/13 0408  NA 137 142 144  K 4.2 3.7 4.4  CL 101 106 104  CO2 _0 GLUCOSE 134* 120* 107*  BUN 24* 21 15  CREATININE 1.06 1.13 1.19  CALCIUM 8.7 8.1* 8.8   Liver Function Tests: No results found for this basename: AST, ALT, ALKPHOS, BILITOT, PROT, ALBUMIN,  in the last 168 hours No results found for this basename: LIPASE, AMYLASE,  in the last 168 hours No results found for this basename: AMMONIA,  in the last 168 hours CBC:  Recent Labs Lab 11/03/13 0843 11/04/13 0440 11/06/13 0408  WBC 11.9* 9.6 11.1*  NEUTROABS 8.8*  --   --   HGB 12.6* 11.4* 13.3  HCT 37.5* 34.6* 40.1  MCV 91.5 94.0 93.0  PLT 325 298 343   Cardiac Enzymes:  Recent Labs Lab 11/03/13 0843  TROPONINI <0.30   BNP: BNP (last 3 results) No results found for this basename: PROBNP,  in the last 8760  hours CBG: No results found for this basename: GLUCAP,  in the last 168 hours     Signed:  [facility]  [facility] 11/07/2013, 12:46 PM

## 2013-11-07 NOTE — Progress Notes (Signed)
Spoke with Mr. Beichner other son, Heron Sabins, who is currently in [place]. Repeating conversation and comfort options for Keith Conrad and gave him time to ask questions. Keith Conrad confirms agreement with his brother, [name], for full comfort measures and to proceed with transfer to hospice care.   [name] appears more comfortable today as secretions appear better controlled. He is still agitated at times. Randall's wife is at bedside. No further questions/concerns at this time.   Vinie Sill, NP Palliative Medicine Team Pager # [phone] (M-F 8a-5p) Team Phone # [phone] (Nights/Weekends)

## 2013-11-09 LAB — CULTURE, BLOOD (ROUTINE X 2)
Culture: NO GROWTH
Culture: NO GROWTH

## 2013-12-17 DEATH — deceased

## 2014-12-17 IMAGING — CT CT HEAD W/O CM
2 series · 16 of 30 positions shown, 19 images · non-contrast
Comparison: 05/31/2013; correlation MRI brain 12/24/2007

CLINICAL DATA: Fell today, laceration the RIGHT temple, confusion,
history dementia

EXAM:
CT HEAD WITHOUT CONTRAST
TECHNIQUE: Contiguous axial images were obtained from the base of the skull
through the vertex without intravenous contrast.

[Series 2: head w/o · axial · non-contrast · 0.45mm/px · z∈[-156,-46]mm · 9 of 28 slices shown, 12 images]
[im 3/28  brain]
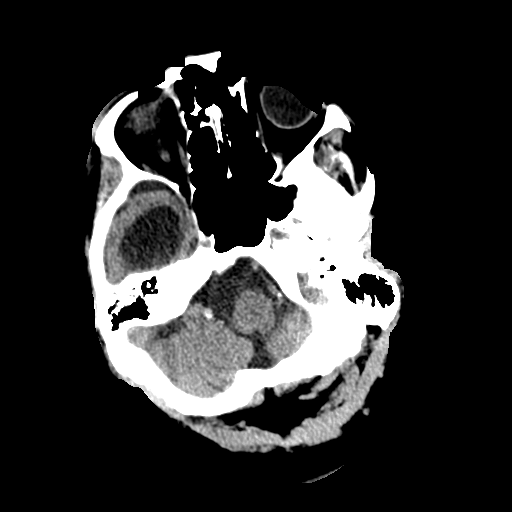
[im 3/28  bone]
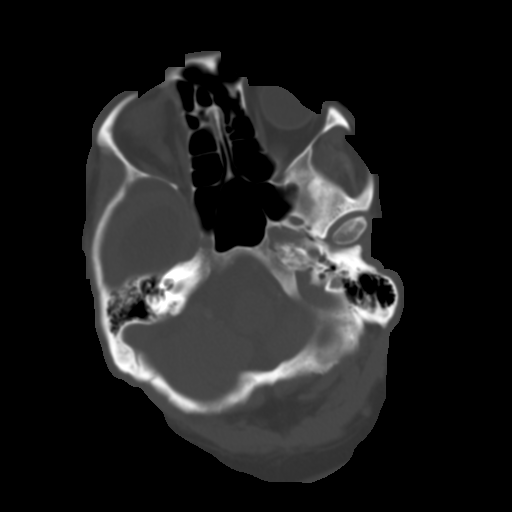
[im 6/28  brain]
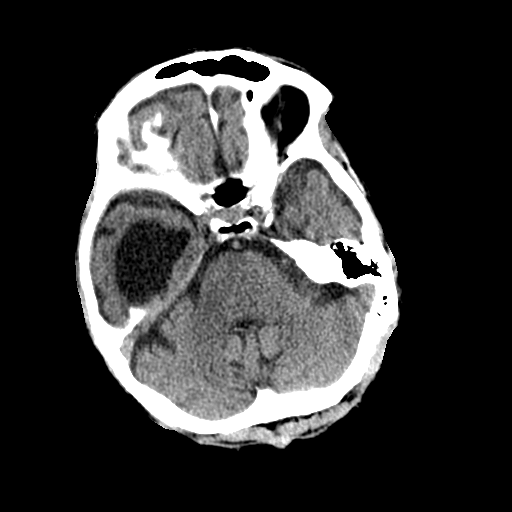
[im 9/28  brain]
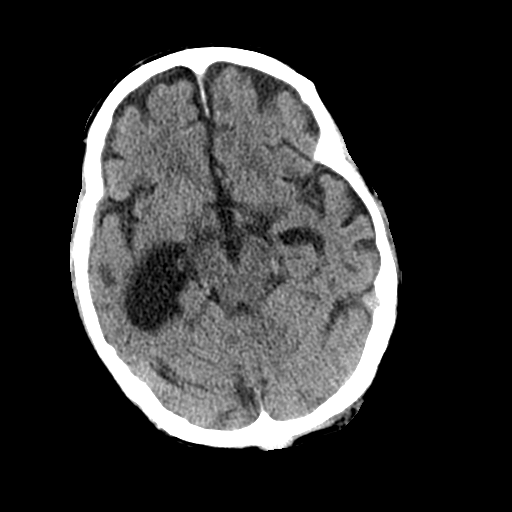
[im 11/28  brain]
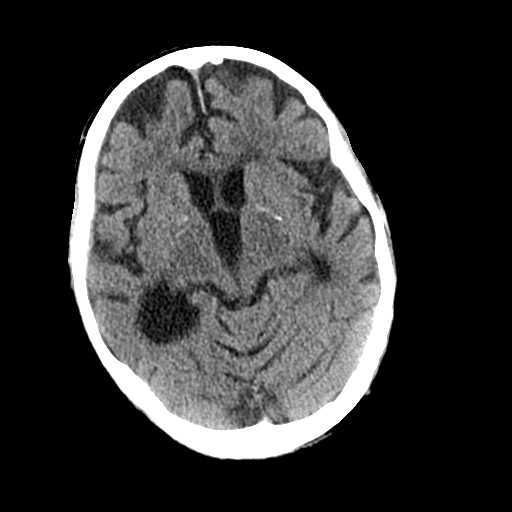
[im 14/28  brain]
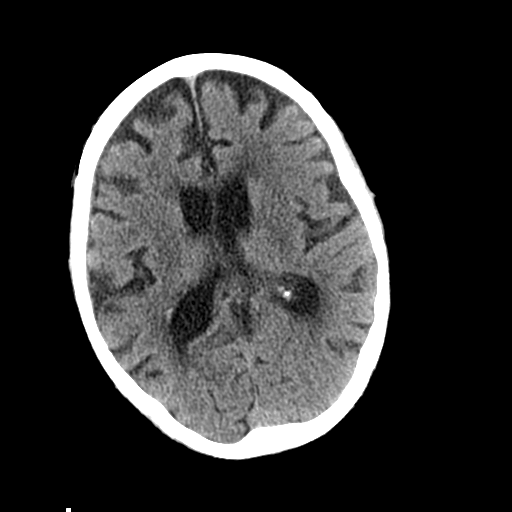
[im 14/28  bone]
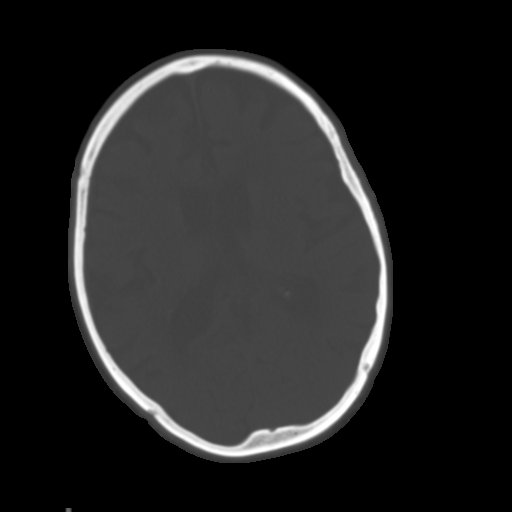
[im 17/28  brain]
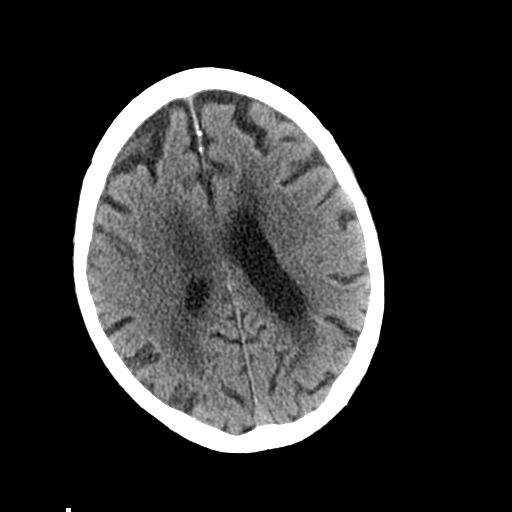
[im 19/28  brain]
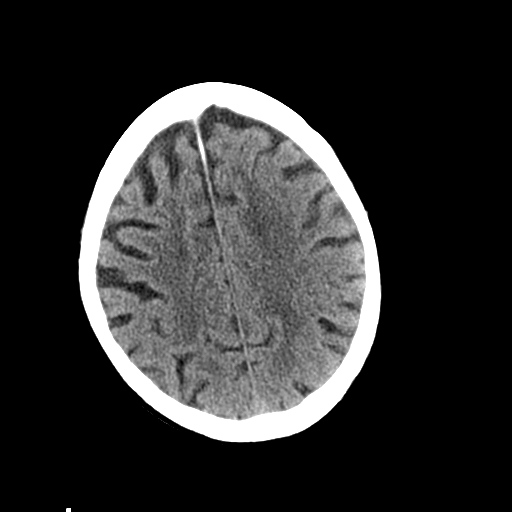
[im 22/28  brain]
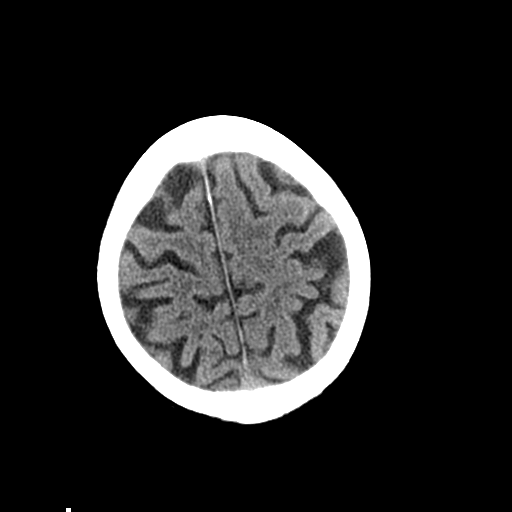
[im 25/28  brain]
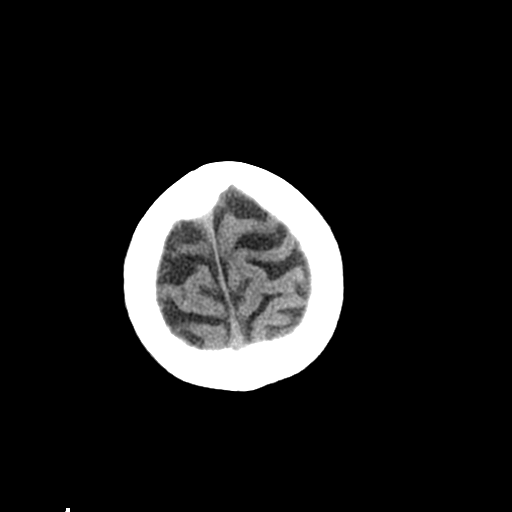
[im 25/28  bone]
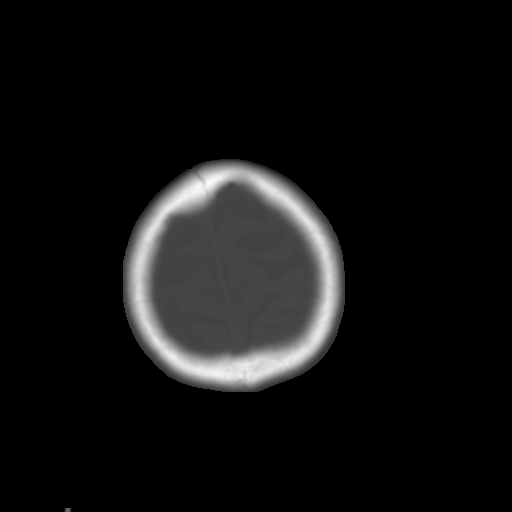

[Series 3: bone windows · axial · 0.45mm/px · z∈[-152,-62]mm · 7 of 46 slices shown]
[im 6/46  bone]
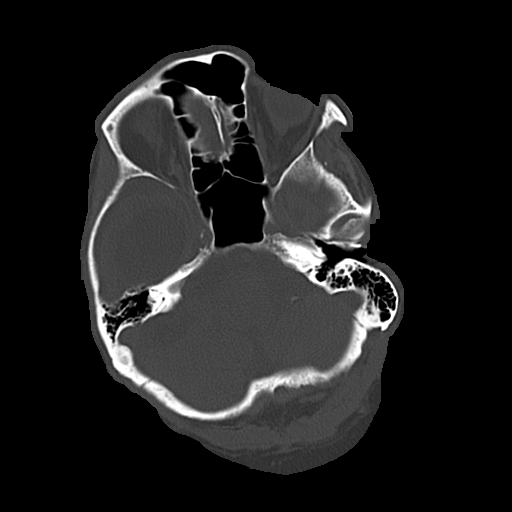
[im 11/46  bone]
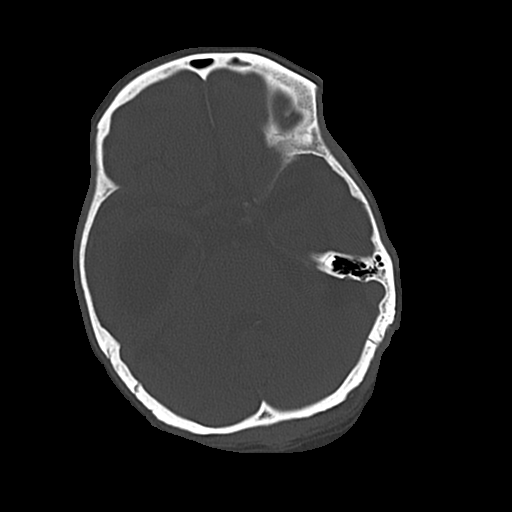
[im 16/46  bone]
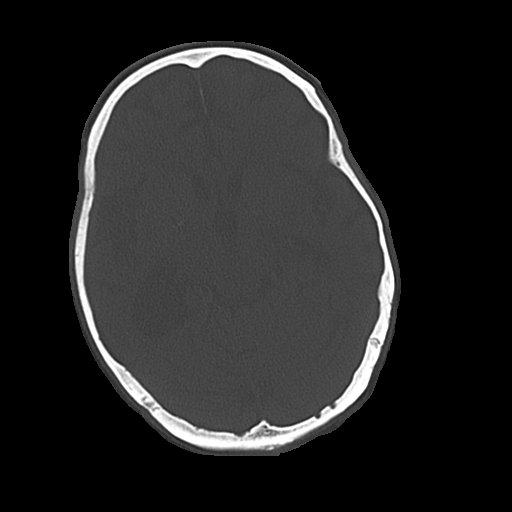
[im 21/46  bone]
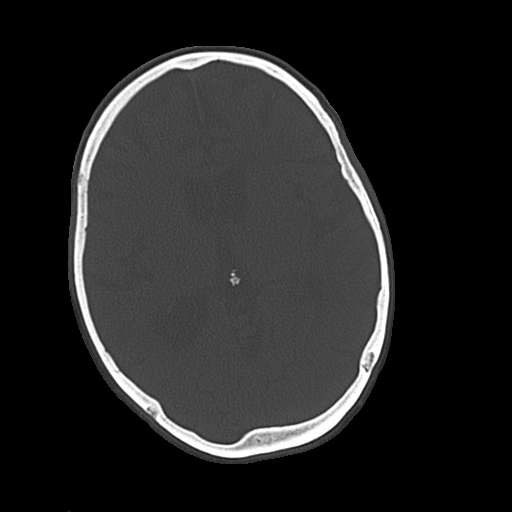
[im 26/46  bone]
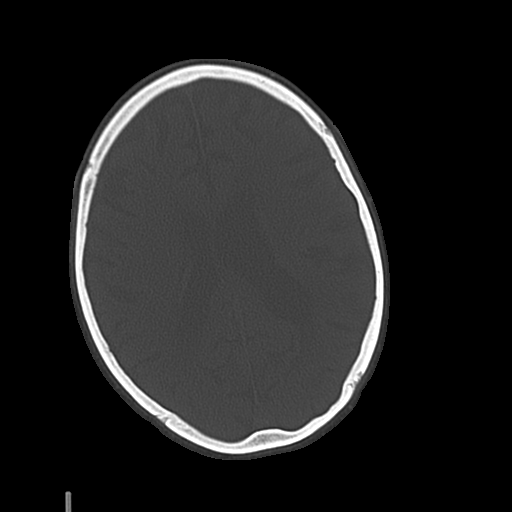
[im 31/46  bone]
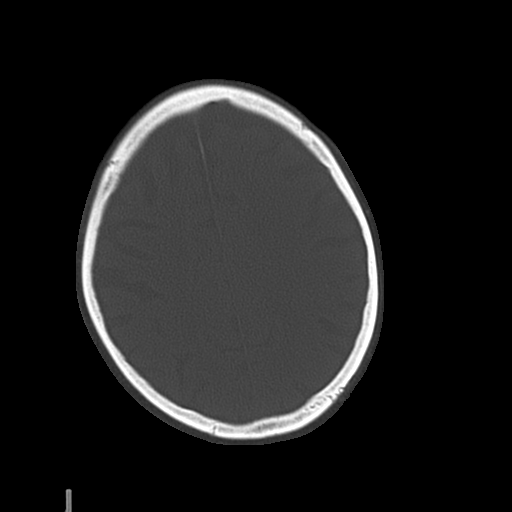
[im 36/46  bone]
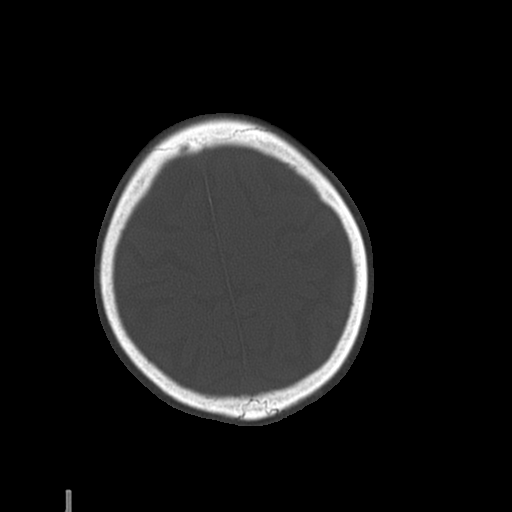

[16 of 30 positions shown; findings below may reference images not displayed]

FINDINGS: Generalized atrophy.

Chronic dilatation of the temporal horn of the RIGHT lateral
ventricle unchanged since 2449.

Small vessel chronic ischemic changes deep cerebral white matter.

No midline shift or mass effect.

No intracranial hemorrhage, mass lesion or evidence acute
infarction.

No extra-axial fluid collections.

Atherosclerotic calcification of internal carotid and vertebral
arteries at skullbase.

Slight deformity of lateral wall of RIGHT orbit image 1, not seen on
prior CT.

Soft tissue swelling/hematoma overlies the lateral aspect of the
LEFT orbital wall.

No additional bone or sinus abnormalities identified.
IMPRESSION: Atrophy with small vessel chronic ischemic changes of deep cerebral
white matter.

Chronic dilatation of the temporal horn of the RIGHT lateral
ventricle, of uncertain etiology but stable since 2449.

No acute intracranial abnormalities.

Slight contour deformity of the lateral wall the RIGHT orbit not
definitely seen on prior CT, question lateral wall RIGHT orbital
fracture.

## 2014-12-27 IMAGING — CR DG CHEST 1V PORT
1 series · 1 of 1 positions shown · non-contrast
Comparison: PA and lateral chest of November 03, 2013

CLINICAL DATA: Shortness of breath with history of coronary artery
disease, cardiomyopathy and CHF. No history of tobacco use.

EXAM:
PORTABLE CHEST - 1 VIEW

[AP]
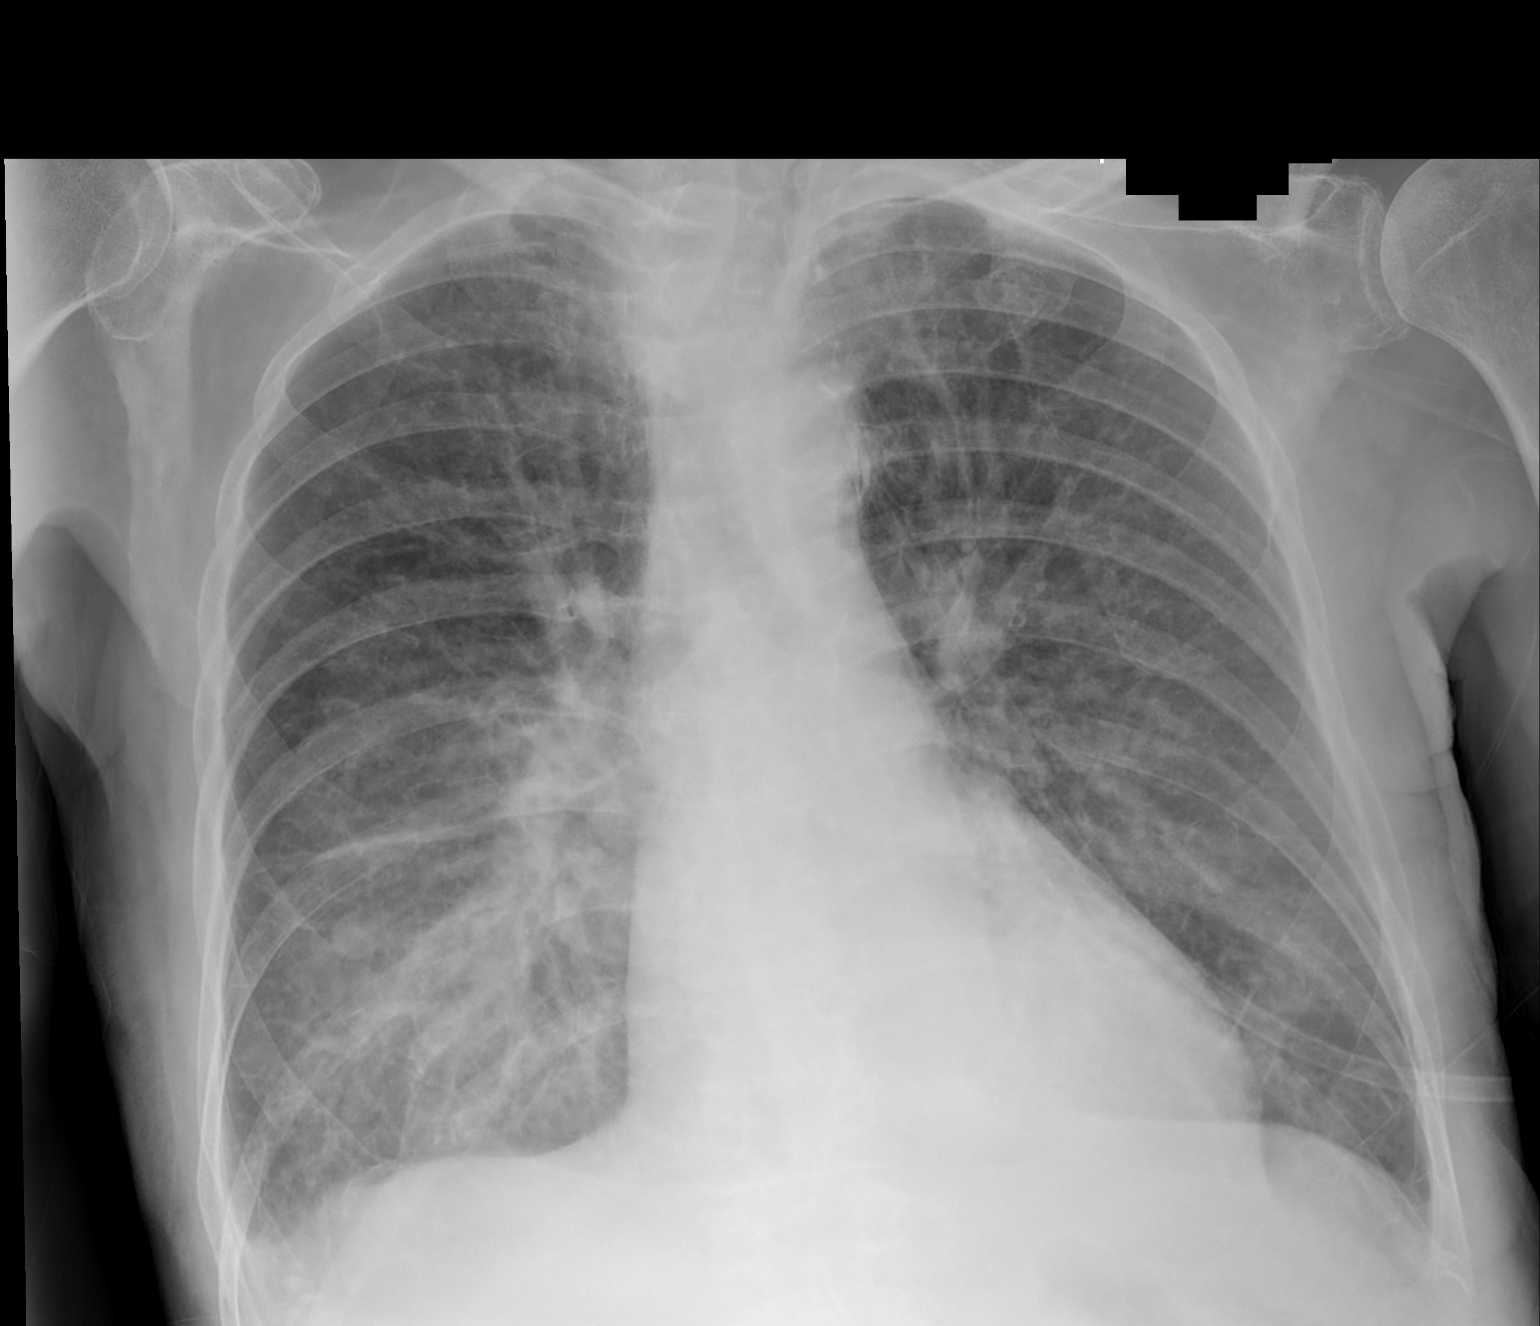

[1 of 1 positions shown; findings below may reference images not displayed]

FINDINGS: The lungs are well-expanded. There is minimal blunting of the right
lateral costophrenic angle which is not entirely new. The pulmonary
vascularity is engorged in the interstitial markings are increased
bilaterally. The cardiopericardial silhouette is top-normal in size
but stable. There is no pleural effusion. The bony thorax is
unremarkable.
IMPRESSION: The findings are consistent with CHF superimposed upon COPD. There
is no focal pneumonia.
